# Patient Record
Sex: Male | Born: 1945 | Race: White | Hispanic: No | Marital: Married | Smoking: Former smoker
Health system: Southern US, Community
[De-identification: ages and names within clinical notes are randomized; demographics above are authoritative.]

## PROBLEM LIST (undated history)

## (undated) DIAGNOSIS — J45909 Unspecified asthma, uncomplicated: Secondary | ICD-10-CM

## (undated) DIAGNOSIS — N529 Male erectile dysfunction, unspecified: Secondary | ICD-10-CM

## (undated) DIAGNOSIS — L4050 Arthropathic psoriasis, unspecified: Secondary | ICD-10-CM

## (undated) DIAGNOSIS — K219 Gastro-esophageal reflux disease without esophagitis: Secondary | ICD-10-CM

## (undated) DIAGNOSIS — I2510 Atherosclerotic heart disease of native coronary artery without angina pectoris: Secondary | ICD-10-CM

## (undated) DIAGNOSIS — L409 Psoriasis, unspecified: Secondary | ICD-10-CM

## (undated) DIAGNOSIS — J449 Chronic obstructive pulmonary disease, unspecified: Secondary | ICD-10-CM

## (undated) DIAGNOSIS — I10 Essential (primary) hypertension: Secondary | ICD-10-CM

## (undated) DIAGNOSIS — Z9289 Personal history of other medical treatment: Secondary | ICD-10-CM

## (undated) DIAGNOSIS — Z9861 Coronary angioplasty status: Secondary | ICD-10-CM

## (undated) DIAGNOSIS — E7800 Pure hypercholesterolemia, unspecified: Secondary | ICD-10-CM

## (undated) HISTORY — DX: Male erectile dysfunction, unspecified: N52.9

## (undated) HISTORY — DX: Psoriasis, unspecified: L40.9

## (undated) HISTORY — PX: NM MYOVIEW LTD: HXRAD82

## (undated) HISTORY — DX: Atherosclerotic heart disease of native coronary artery without angina pectoris: I25.10

## (undated) HISTORY — DX: Unspecified asthma, uncomplicated: J45.909

## (undated) HISTORY — DX: Personal history of other medical treatment: Z92.89

## (undated) HISTORY — DX: Arthropathic psoriasis, unspecified: L40.50

## (undated) HISTORY — DX: Coronary angioplasty status: Z98.61

## (undated) HISTORY — DX: Chronic obstructive pulmonary disease, unspecified: J44.9

---

## 1996-11-30 DIAGNOSIS — I2510 Atherosclerotic heart disease of native coronary artery without angina pectoris: Secondary | ICD-10-CM

## 1996-11-30 HISTORY — DX: Atherosclerotic heart disease of native coronary artery without angina pectoris: I25.10

## 1997-06-06 HISTORY — PX: CORONARY STENT PLACEMENT: SHX1402

## 1998-03-27 ENCOUNTER — Ambulatory Visit (HOSPITAL_COMMUNITY): Admission: RE | Admit: 1998-03-27 | Discharge: 1998-03-27 | Payer: Self-pay | Admitting: Cardiology

## 1998-03-30 HISTORY — PX: CARDIAC CATHETERIZATION: SHX172

## 1998-04-03 ENCOUNTER — Ambulatory Visit (HOSPITAL_COMMUNITY): Admission: RE | Admit: 1998-04-03 | Discharge: 1998-04-03 | Payer: Self-pay | Admitting: Cardiology

## 1998-07-22 ENCOUNTER — Encounter: Admission: RE | Admit: 1998-07-22 | Discharge: 1998-07-22 | Payer: Self-pay | Admitting: *Deleted

## 1998-11-27 ENCOUNTER — Encounter: Payer: Self-pay | Admitting: *Deleted

## 1998-11-27 ENCOUNTER — Ambulatory Visit (HOSPITAL_COMMUNITY): Admission: RE | Admit: 1998-11-27 | Discharge: 1998-11-27 | Payer: Self-pay | Admitting: *Deleted

## 1998-11-28 ENCOUNTER — Observation Stay (HOSPITAL_COMMUNITY): Admission: EM | Admit: 1998-11-28 | Discharge: 1998-11-29 | Payer: Self-pay | Admitting: Certified Registered"

## 1998-12-23 ENCOUNTER — Ambulatory Visit (HOSPITAL_COMMUNITY): Admission: RE | Admit: 1998-12-23 | Discharge: 1998-12-23 | Payer: Self-pay | Admitting: Gastroenterology

## 2001-07-14 ENCOUNTER — Encounter: Payer: Self-pay | Admitting: Cardiology

## 2001-07-14 ENCOUNTER — Ambulatory Visit (HOSPITAL_COMMUNITY): Admission: RE | Admit: 2001-07-14 | Discharge: 2001-07-14 | Payer: Self-pay | Admitting: Cardiology

## 2001-12-06 HISTORY — PX: CORONARY ANGIOPLASTY: SHX604

## 2001-12-24 ENCOUNTER — Encounter: Payer: Self-pay | Admitting: Emergency Medicine

## 2001-12-24 ENCOUNTER — Inpatient Hospital Stay (HOSPITAL_COMMUNITY): Admission: EM | Admit: 2001-12-24 | Discharge: 2001-12-27 | Payer: Self-pay | Admitting: Emergency Medicine

## 2001-12-25 ENCOUNTER — Encounter: Payer: Self-pay | Admitting: Cardiovascular Disease

## 2002-05-09 ENCOUNTER — Ambulatory Visit (HOSPITAL_COMMUNITY): Admission: RE | Admit: 2002-05-09 | Discharge: 2002-05-10 | Payer: Self-pay | Admitting: Cardiology

## 2002-05-10 HISTORY — PX: CORONARY STENT PLACEMENT: SHX1402

## 2002-10-10 ENCOUNTER — Ambulatory Visit (HOSPITAL_COMMUNITY): Admission: RE | Admit: 2002-10-10 | Discharge: 2002-10-10 | Payer: Self-pay | Admitting: Specialist

## 2002-10-10 ENCOUNTER — Encounter: Payer: Self-pay | Admitting: Specialist

## 2002-11-21 ENCOUNTER — Encounter: Admission: RE | Admit: 2002-11-21 | Discharge: 2002-11-21 | Payer: Self-pay | Admitting: Otolaryngology

## 2002-11-21 ENCOUNTER — Encounter: Payer: Self-pay | Admitting: Otolaryngology

## 2004-01-01 HISTORY — PX: CARDIAC CATHETERIZATION: SHX172

## 2004-01-05 DIAGNOSIS — Z9289 Personal history of other medical treatment: Secondary | ICD-10-CM

## 2004-01-05 HISTORY — DX: Personal history of other medical treatment: Z92.89

## 2004-01-16 ENCOUNTER — Ambulatory Visit (HOSPITAL_COMMUNITY): Admission: RE | Admit: 2004-01-16 | Discharge: 2004-01-16 | Payer: Self-pay | Admitting: Otolaryngology

## 2004-01-16 ENCOUNTER — Ambulatory Visit: Admission: RE | Admit: 2004-01-16 | Discharge: 2004-01-16 | Payer: Self-pay | Admitting: Otolaryngology

## 2004-01-16 IMAGING — CR DG CHEST 2V
2 series · 2 of 2 positions shown · non-contrast
Comparison: none

CLINICAL DATA: Preoperative exam for sinus disease. 
 CHEST, TWO VIEWS  
 The heart and mediastinum are normal.  The lungs are clear.  No soft tissue or bony abnormality. 
 IMPRESSION
 1.  Normal chest.

[view not recorded (1 of 2)]
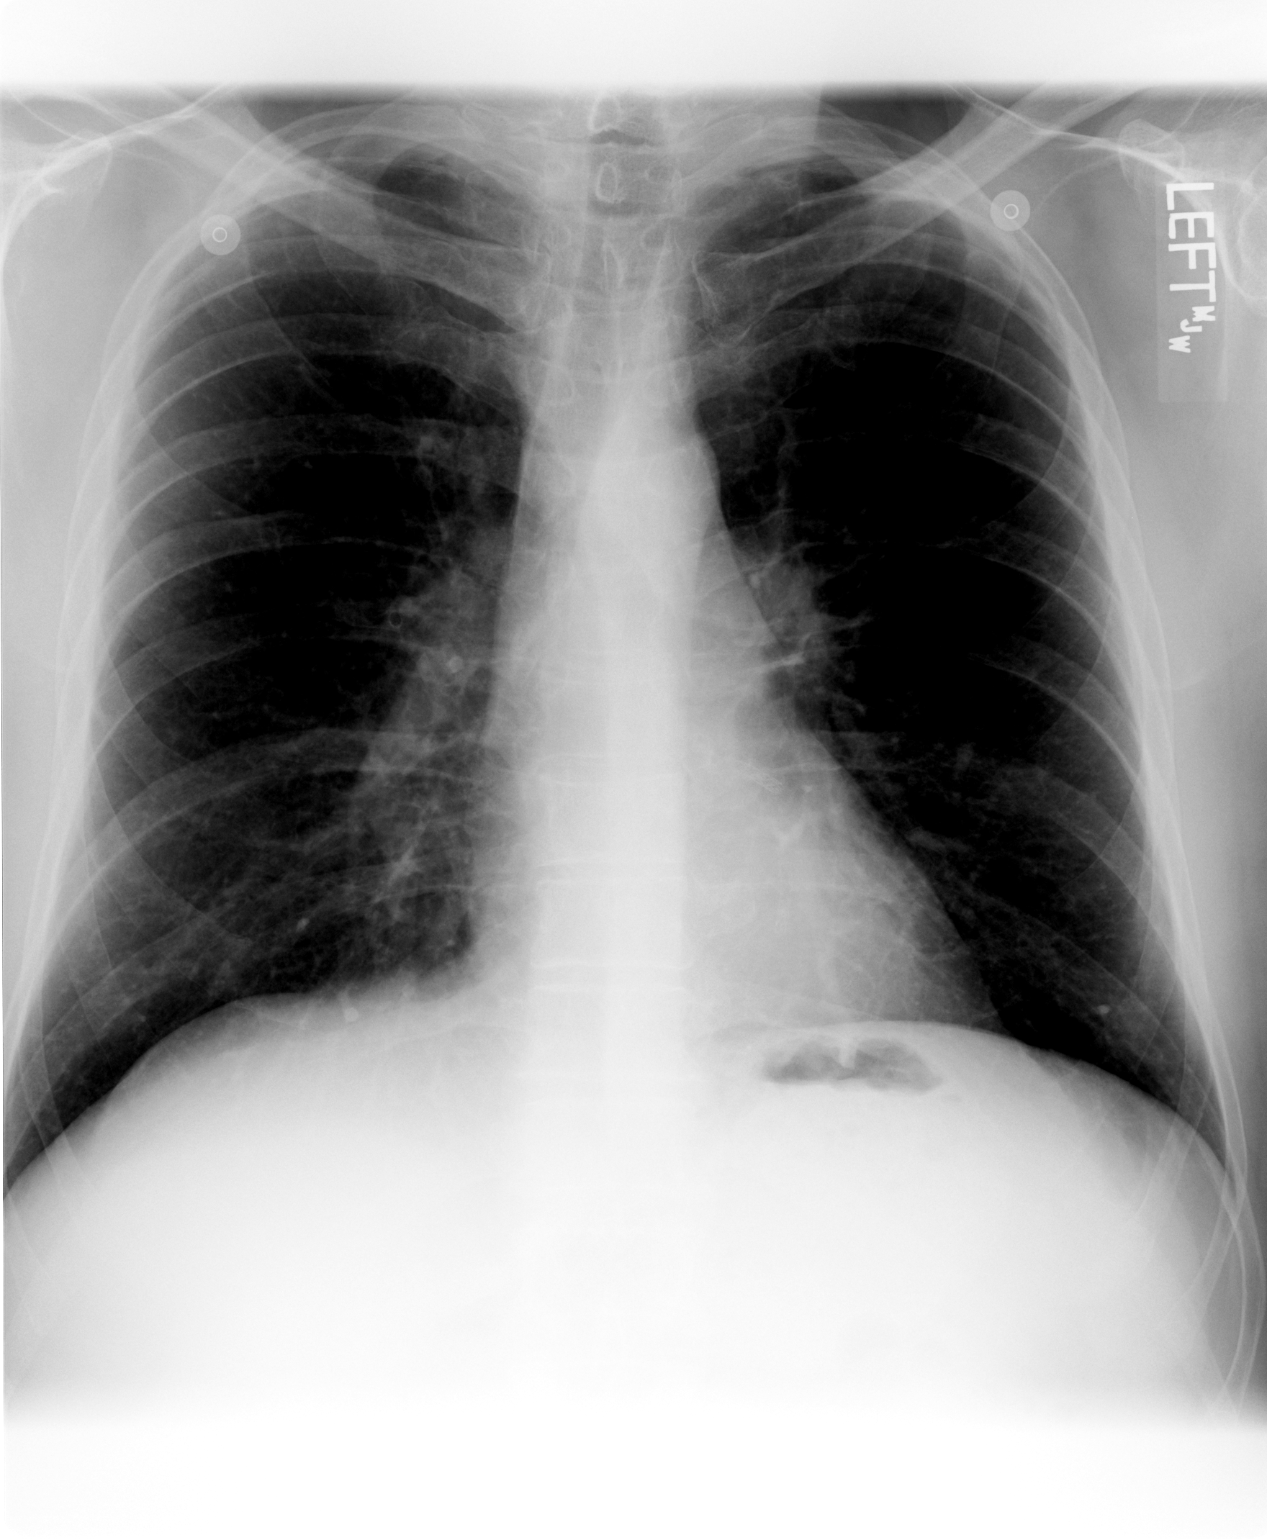

[view not recorded (2 of 2)]
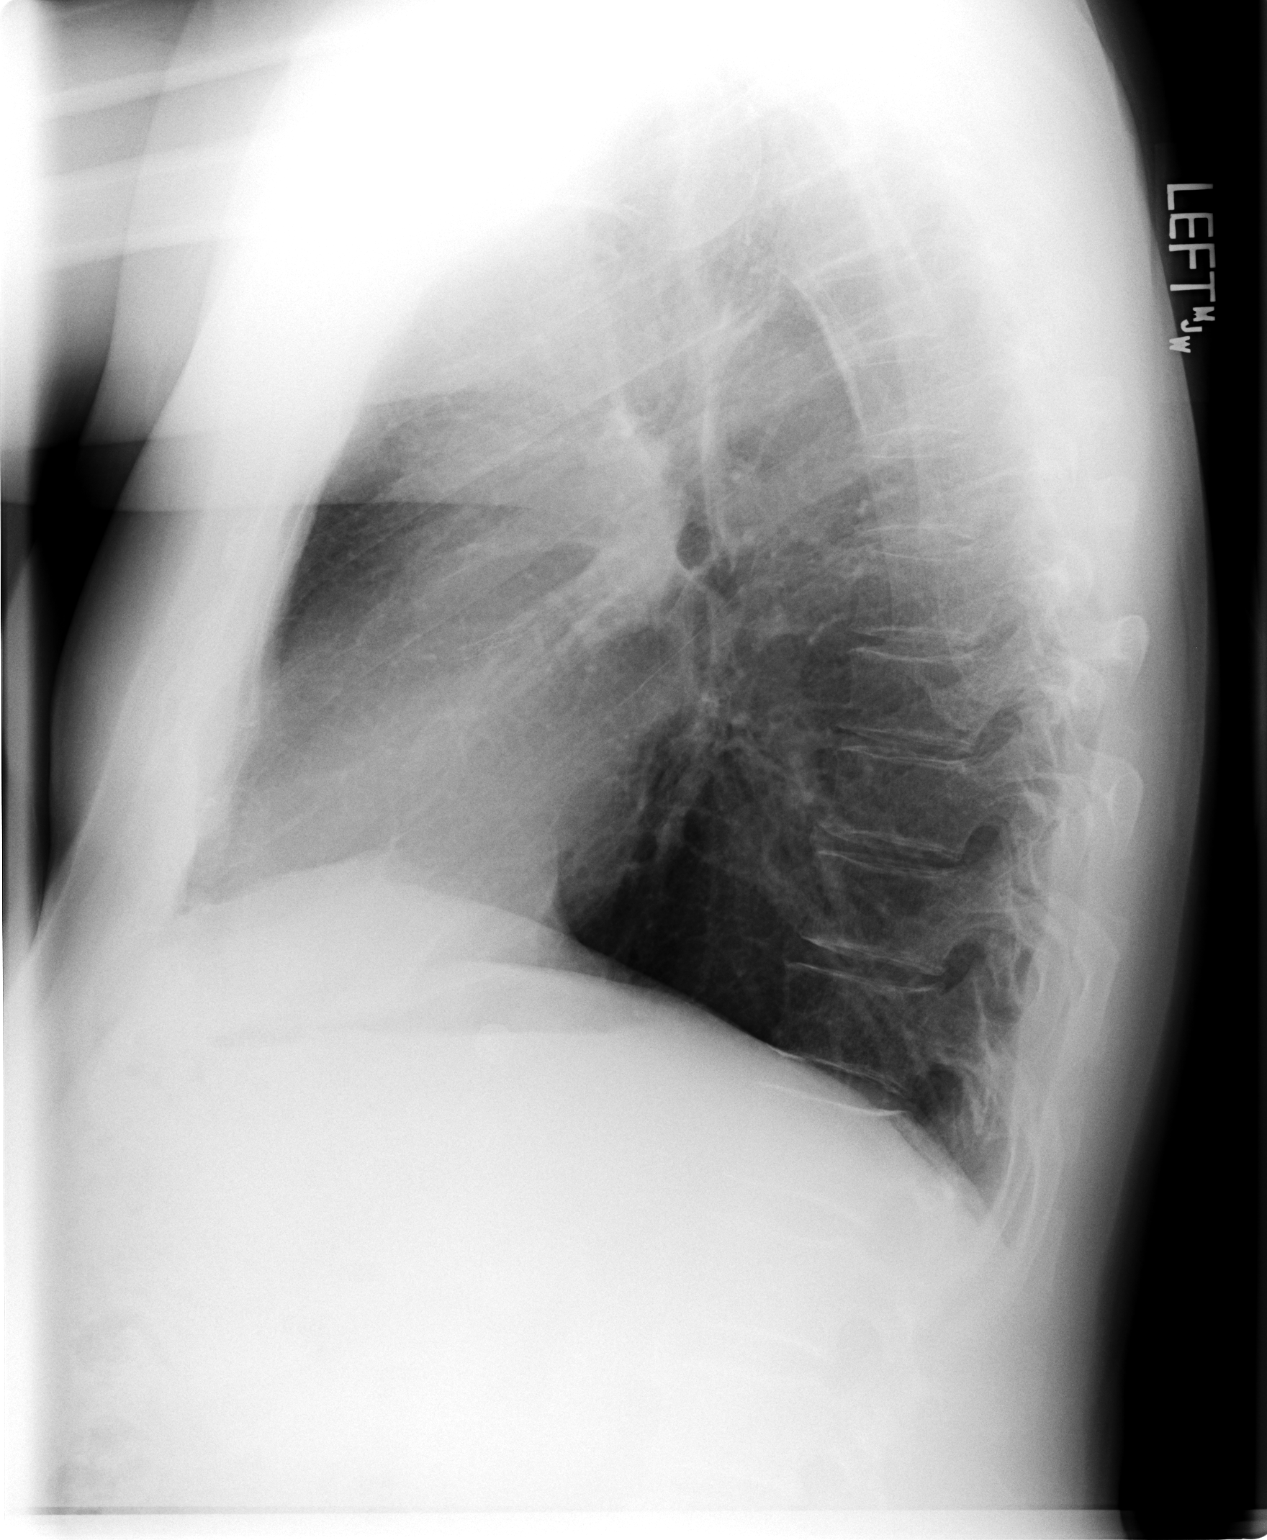

[2 of 2 positions shown; findings below may reference images not displayed]

## 2004-01-22 ENCOUNTER — Ambulatory Visit (HOSPITAL_COMMUNITY): Admission: RE | Admit: 2004-01-22 | Discharge: 2004-01-22 | Payer: Self-pay | Admitting: Cardiology

## 2004-04-30 ENCOUNTER — Ambulatory Visit (HOSPITAL_COMMUNITY): Admission: RE | Admit: 2004-04-30 | Discharge: 2004-05-01 | Payer: Self-pay | Admitting: Otolaryngology

## 2008-08-21 LAB — HM COLONOSCOPY

## 2011-01-08 ENCOUNTER — Ambulatory Visit
Admission: RE | Admit: 2011-01-08 | Discharge: 2011-01-08 | Disposition: A | Discharge status: Home / Self Care | Payer: BC Managed Care – PPO | Source: Ambulatory Visit | Attending: Family Medicine | Admitting: Family Medicine

## 2011-01-08 ENCOUNTER — Other Ambulatory Visit: Payer: Self-pay | Admitting: Family Medicine

## 2011-01-08 DIAGNOSIS — R52 Pain, unspecified: Secondary | ICD-10-CM

## 2013-03-02 ENCOUNTER — Emergency Department (HOSPITAL_COMMUNITY)
Admission: EM | Admit: 2013-03-02 | Discharge: 2013-03-02 | Disposition: A | Discharge status: Home / Self Care | Payer: Medicare Other | Attending: Emergency Medicine | Admitting: Emergency Medicine

## 2013-03-02 ENCOUNTER — Emergency Department (HOSPITAL_COMMUNITY): Payer: Medicare Other

## 2013-03-02 ENCOUNTER — Other Ambulatory Visit: Payer: Self-pay

## 2013-03-02 ENCOUNTER — Encounter (HOSPITAL_COMMUNITY): Payer: Self-pay

## 2013-03-02 DIAGNOSIS — Z79899 Other long term (current) drug therapy: Secondary | ICD-10-CM | POA: Insufficient documentation

## 2013-03-02 DIAGNOSIS — Z9861 Coronary angioplasty status: Secondary | ICD-10-CM | POA: Insufficient documentation

## 2013-03-02 DIAGNOSIS — E7800 Pure hypercholesterolemia, unspecified: Secondary | ICD-10-CM | POA: Insufficient documentation

## 2013-03-02 DIAGNOSIS — R0602 Shortness of breath: Secondary | ICD-10-CM | POA: Insufficient documentation

## 2013-03-02 DIAGNOSIS — Z87891 Personal history of nicotine dependence: Secondary | ICD-10-CM | POA: Insufficient documentation

## 2013-03-02 DIAGNOSIS — Z7982 Long term (current) use of aspirin: Secondary | ICD-10-CM | POA: Insufficient documentation

## 2013-03-02 DIAGNOSIS — Z9889 Other specified postprocedural states: Secondary | ICD-10-CM | POA: Insufficient documentation

## 2013-03-02 DIAGNOSIS — H81399 Other peripheral vertigo, unspecified ear: Secondary | ICD-10-CM | POA: Insufficient documentation

## 2013-03-02 DIAGNOSIS — I10 Essential (primary) hypertension: Secondary | ICD-10-CM | POA: Insufficient documentation

## 2013-03-02 DIAGNOSIS — K219 Gastro-esophageal reflux disease without esophagitis: Secondary | ICD-10-CM | POA: Insufficient documentation

## 2013-03-02 HISTORY — DX: Essential (primary) hypertension: I10

## 2013-03-02 HISTORY — DX: Gastro-esophageal reflux disease without esophagitis: K21.9

## 2013-03-02 HISTORY — DX: Pure hypercholesterolemia, unspecified: E78.00

## 2013-03-02 LAB — CBC
Hemoglobin: 12.4 g/dL — ABNORMAL LOW (ref 13.0–17.0)
RBC: 4.08 MIL/uL — ABNORMAL LOW (ref 4.22–5.81)

## 2013-03-02 LAB — BASIC METABOLIC PANEL
CO2: 29 mEq/L (ref 19–32)
Glucose, Bld: 106 mg/dL — ABNORMAL HIGH (ref 70–99)
Potassium: 3.4 mEq/L — ABNORMAL LOW (ref 3.5–5.1)
Sodium: 138 mEq/L (ref 135–145)

## 2013-03-02 LAB — POCT I-STAT TROPONIN I

## 2013-03-02 LAB — D-DIMER, QUANTITATIVE: D-Dimer, Quant: 0.32 ug/mL-FEU (ref 0.00–0.48)

## 2013-03-02 MED ORDER — ASPIRIN 325 MG PO TABS
325.0000 mg | ORAL_TABLET | Freq: Once | ORAL | Status: AC
  Administered 2013-03-02: 325 mg via ORAL
  Filled 2013-03-02: qty 1

## 2013-03-02 MED ORDER — LORAZEPAM 2 MG/ML IJ SOLN
1.0000 mg | Freq: Once | INTRAMUSCULAR | Status: AC
  Administered 2013-03-02: 1 mg via INTRAVENOUS

## 2013-03-02 MED ORDER — ONDANSETRON 4 MG PO TBDP: ORAL_TABLET | ORAL | Status: DC

## 2013-03-02 MED ORDER — MECLIZINE HCL 50 MG PO TABS: 25.0000 mg | ORAL_TABLET | Freq: Three times a day (TID) | ORAL | Status: DC | PRN

## 2013-03-02 MED ORDER — LORAZEPAM 2 MG/ML IJ SOLN
INTRAMUSCULAR | Status: AC
  Filled 2013-03-02: qty 1

## 2013-03-02 NOTE — ED Notes (Signed)
Yelverton MD at bedside. 

## 2013-03-02 NOTE — ED Notes (Signed)
Pt c/o sudden onset of dizziness, head spinning, unable to fully stand up, SOB, and mid-sternum  Chest pressure starting approx 1300 today. Pt denies cough, congestion, N/V/D, back/abd pain, or diaphoresis

## 2013-03-02 NOTE — ED Notes (Signed)
Pt dc'd home w/all belongings, alert and ambulatory upon dc, pt verbalizes understanding of dc instructions, driven home by spouse , 2 new rx prescribed

## 2013-03-02 NOTE — ED Notes (Addendum)
Pt was on his tractor an hour ago and began to feel dizzy. States he has chest pressure and discomfort and He has a hx of 2 stents. Took 1 nitro with no relief.

## 2013-03-02 NOTE — ED Notes (Signed)
Pt placed on heart, spo2, bp monitor. Pt place in gown. Family at bedside

## 2013-03-02 NOTE — ED Notes (Signed)
Pt ambulated to bathroom, steady gait noted

## 2013-03-02 NOTE — ED Provider Notes (Signed)
History     CSN: [id number]  Arrival date & time 03/02/13  1430   First MD Initiated Contact with Patient 03/02/13 1513      Chief Complaint  Patient presents with  . Chest Pain    (Consider location/radiation/quality/duration/timing/severity/associated sxs/prior treatment) HPI Pt states that at roughly 1330 today pt was working on tractor and had sudden onset spinning sensation and loss of balance. No nausea. No focal weakness or sensory changes. Pt states he was breathing heavy but denies chest pain. He took a NTG without relief. His dizziness has since improved though not completely abated. Worse with movement of his head. Similar episode several weeks ago that lasted several hours and resolved. No recent illnesses. No ear pain or hearing changes.  Past Medical History  Diagnosis Date  . Hypertension   . GERD (gastroesophageal reflux disease)   . Hypercholesteremia     Past Surgical History  Procedure Laterality Date  . Cardiac surgery    . Coronary stent placement      No family history on file.  History  Substance Use Topics  . Smoking status: Former Games developer  . Smokeless tobacco: Current User  . Alcohol Use: Yes     Comment: occassionally      Review of Systems  Constitutional: Negative for fever and chills.  HENT: Negative for neck pain.   Eyes: Negative for visual disturbance.  Respiratory: Positive for shortness of breath. Negative for cough, chest tightness and wheezing.   Cardiovascular: Negative for chest pain, palpitations and leg swelling.  Gastrointestinal: Negative for nausea, vomiting and abdominal pain.  Musculoskeletal: Positive for gait problem. Negative for back pain.  Skin: Negative for rash and wound.  Neurological: Positive for dizziness. Negative for speech difficulty, weakness, light-headedness, numbness and headaches.  All other systems reviewed and are negative.    Allergies  Review of patient's allergies indicates no known  allergies.  Home Medications   Current Outpatient Rx  Name  Route  Sig  Dispense  Refill  . aspirin EC 81 MG tablet   Oral   Take 81 mg by mouth daily.         [name] etanercept (ENBREL) 50 MG/ML injection   Subcutaneous   Inject 50 mg into the skin 3 (three) times a week.         [name] omeprazole (PRILOSEC OTC) 20 MG tablet   Oral   Take 20 mg by mouth daily.         . simvastatin (ZOCOR) 20 MG tablet   Oral   Take 20 mg by mouth every morning.         . meclizine (ANTIVERT) 50 MG tablet   Oral   Take 0.5 tablets (25 mg total) by mouth 3 (three) times daily as needed for dizziness.   30 tablet   0   . ondansetron (ZOFRAN ODT) 4 MG disintegrating tablet      4mg  ODT q4 hours prn nausea/vomit   10 tablet   0     BP 122/66  Pulse 62  Temp(Src) 97.9 F (36.6 C) (Oral)  Resp 20  Ht 5\' 10"  (1.778 m)  Wt 225 lb (102.059 kg)  BMI 32.28 kg/m2  SpO2 96%  Physical Exam  Nursing note and vitals reviewed. Constitutional: He is oriented to person, place, and time. He appears well-developed and well-nourished. No distress.  HENT:  Head: Normocephalic and atraumatic.  Right Ear: External ear normal.  Left Ear: External ear normal.  Mouth/Throat: Oropharynx is  clear and moist.  Eyes: EOM are normal. Pupils are equal, round, and reactive to light.  Fatigable horizontal nystagmus   Neck: Normal range of motion. Neck supple.  Cardiovascular: Normal rate and regular rhythm.   Pulmonary/Chest: Effort normal and breath sounds normal. No respiratory distress. He has no wheezes. He has no rales. He exhibits no tenderness.  Abdominal: Soft. Bowel sounds are normal. He exhibits no distension and no mass. There is no tenderness. There is no rebound and no guarding.  Musculoskeletal: Normal range of motion. He exhibits no edema and no tenderness.  Neurological: He is alert and oriented to person, place, and time.  CN II-XII intact. 5/5 motor in all ext, sensation intact. bl finger to  nose intact.   Skin: Skin is warm and dry. No rash noted. No erythema.  Psychiatric: He has a normal mood and affect. His behavior is normal.    ED Course  Procedures (including critical care time)  Labs Reviewed  CBC - Abnormal; Notable for the following:    RBC 4.08 (*)    Hemoglobin 12.4 (*)    HCT 35.3 (*)    All other components within normal limits  BASIC METABOLIC PANEL - Abnormal; Notable for the following:    Potassium 3.4 (*)    Glucose, Bld 106 (*)    GFR calc non Af Amer 72 (*)    GFR calc Af Amer 83 (*)    All other components within normal limits  D-DIMER, QUANTITATIVE  TROPONIN I  POCT I-STAT TROPONIN I   Dg Chest 2 View  03/02/2013  *RADIOLOGY REPORT*  Clinical Data: Chest pain.  CHEST - 2 VIEW  Comparison: January 16, 2004.  Findings: Cardiomediastinal silhouette appears normal.  No acute pulmonary disease is noted.  Bony thorax is intact.  IMPRESSION: No acute cardiopulmonary abnormality seen.   Original Report Authenticated By: Lupita Raider.,  M.D.    Mr Brain Wo Contrast  03/02/2013  *RADIOLOGY REPORT*  Clinical Data: Vertigo.  Rule out stroke  MRI HEAD WITHOUT CONTRAST  Technique:  Multiplanar, multiecho pulse sequences of the brain and surrounding structures were obtained according to standard protocol without intravenous contrast.  Comparison: None  Findings: Negative for acute infarct.  Mild chronic microvascular ischemic changes in the white matter and pons.  Negative for mass or edema.  No hemorrhage or fluid collection.  Extensive mucosal edema in the paranasal sinuses.  Prior medial antrectomy bilaterally.  IMPRESSION: Negative for acute infarct.  Chronic sinus disease.   Original Report Authenticated By: Janeece Riggers, M.D.      1. Peripheral vertigo, unspecified laterality      Date: 03/02/2013  Rate: 67  Rhythm: normal sinus rhythm  QRS Axis: normal  Intervals: normal  ST/T Wave abnormalities: normal  Conduction Disutrbances:none  Narrative  Interpretation:   Old EKG Reviewed: none available    MDM   Pt required dose of ativan for MRI. Though Vertigo had resolved had mild lightheadedness after medication. Pt has now been observed in Ed >7 hours. He is ambulating without assistance. Denies chest pain or SOB. Trop x 2 neg. MRI neg for acute stroke. Believe symptoms represent peripheral vertigo. Will prescribe meclizine and given ENT f/u. Pt given return precautions.         Loren Racer, MD 03/02/13 2152

## 2013-04-27 ENCOUNTER — Other Ambulatory Visit: Payer: Self-pay

## 2013-04-27 MED ORDER — METOPROLOL SUCCINATE ER 25 MG PO TB24: 25.0000 mg | ORAL_TABLET | Freq: Every day | ORAL | Status: DC

## 2013-04-27 NOTE — Telephone Encounter (Signed)
Rx was sent to pharmacy electronically. 

## 2013-05-31 ENCOUNTER — Encounter: Payer: Self-pay | Admitting: *Deleted

## 2013-06-05 ENCOUNTER — Encounter: Payer: Self-pay | Admitting: Cardiology

## 2013-06-07 ENCOUNTER — Ambulatory Visit (INDEPENDENT_AMBULATORY_CARE_PROVIDER_SITE_OTHER): Payer: Medicare Other | Admitting: Cardiology

## 2013-06-07 ENCOUNTER — Encounter: Payer: Self-pay | Admitting: Cardiology

## 2013-06-07 VITALS — BP 132/82 | HR 70 | Ht 70.0 in | Wt 212.9 lb

## 2013-06-07 DIAGNOSIS — Z9861 Coronary angioplasty status: Secondary | ICD-10-CM

## 2013-06-07 DIAGNOSIS — E782 Mixed hyperlipidemia: Secondary | ICD-10-CM

## 2013-06-07 DIAGNOSIS — I10 Essential (primary) hypertension: Secondary | ICD-10-CM

## 2013-06-07 DIAGNOSIS — I2510 Atherosclerotic heart disease of native coronary artery without angina pectoris: Secondary | ICD-10-CM

## 2013-06-07 DIAGNOSIS — N529 Male erectile dysfunction, unspecified: Secondary | ICD-10-CM

## 2013-06-07 DIAGNOSIS — Z79899 Other long term (current) drug therapy: Secondary | ICD-10-CM

## 2013-06-07 DIAGNOSIS — E7800 Pure hypercholesterolemia, unspecified: Secondary | ICD-10-CM

## 2013-06-07 NOTE — Progress Notes (Signed)
Patient ID: Brad Owen, male   DOB: 06-16-1946, 67 y.o.   MRN: [id number]  Clinic Note: HPI: Brad Owen is a 67 y.o. male with a PMH below who presents today for six-month followup. He is a former patient of Dr. Junie Bame was soft the first time back in January. I will her to see a bit sooner than one year since because he been unable to dyspnea on exertion to the lip above what he normally had. 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 His very active doing heavy exertional labor. This in addition to walking all of the golf course. While he does get short of breath with exertion and is probably lower than it had been before. He does not have any anginal chest discomfort with this level of exertion uncertain at rest. He does not a little ankle swelling but not real significant edema. No PND or orthopnea. He really states that that has not changed dramatically the last 6 months, does not really bother him that much. He denies any palpitations, lightheadedness, dizziness, weakness or syncope/near syncope. No TIA or adverse reactions. No melena, hematochezia, hematuria or nosebleeds. No claudication symptoms. He still notes a little orthostatic dizziness, but not so bad when he is adequately hydrated.  Past Medical History  Diagnosis Date  . Hypertension   . GERD (gastroesophageal reflux disease)   . Hypercholesteremia   . CAD S/P percutaneous coronary angioplasty 1998    BMS to OM, followed by DES in 2003: PTCA a side branch.; Patent stents in 2005.  Brad Owen History of stress test 01/05/2004    abnormal stress test, positive bruce protocol exercise stress test with scintigraphic evidence of mild lateral wall ischemia at the apex. Dynamic gating reveals a preserved EF at 60% by QGS  .  Psoriasis     Patchy psoriasis  . Erectile dysfunction     Prior Cardiac Evaluation and Past Surgical History: Past Surgical History  Procedure Laterality Date  . Cardiac surgery    . Coronary stent placement  06/06/1997    PTCA/Stent Placement-LAD A 3.0 x 15 mm [place] balloon. a 3.0 x 18 mm AVE (GFX) stent, a JL 4 guide catheter and a 300 cm extra-support wire. Because of its proximal location in the LAD, the decision was made to place an AVE stent. the stent we put was placed in position and deployed at 8 atmospheres for 45 seconds.A successful angioplasty and stent placement to a 90% proximal eccentric LAD lesi  . Cardiac catheterization  03/1998    Normal left ventricular systolic function, Minor narrowing in the body of the stent, minor tapering in the distal RCA at the area of the PDA.  . Cardiac catheterization  12/06/2001    STENTS: At this time the 2.5 x 10 cutting balloon was placed down the OM and a total of four inflations were performed, 10 for 58,12 for 62, 12 for 62, and 12x61. these for inflation at the bifurcation didi not impinge on the ostium of the branch, resulted in brisk distal flow and no evidence of any dissection or thrombus.  . Cardiac catheterization  05/10/2002    STENTS: the 2.5 x 15mm [place] balloon was placed in the ostium of the vessel and  2 inflation, 7 x 65 and 8 x 60 were performed, there was less than 10% area of narrowing in the otium of this vassel and there was always TIMI-3 flow, there was also cutting ballon PTCA of the OM branch that was pinched by the stent.  . Cardiac catheterization  02.2005    Normal left ventricular systolic function, patent stents in both the circumflex and left anterior descending with only minimal in-stent restenosis in the left anterior descending.    No Known Allergies  Current Outpatient Prescriptions  Medication Sig Dispense Refill  . aspirin EC 81 MG tablet Take 81 mg by mouth daily.      Brad Owen etanercept (ENBREL) 50  MG/ML injection Inject 50 mg into the skin 3 (three) times a week.      . meclizine (ANTIVERT) 50 MG tablet Take 0.5 tablets (25 mg total) by mouth 3 (three) times daily as needed for dizziness.  30 tablet  0  . metoprolol succinate (TOPROL-XL) 25 MG 24 hr tablet Take 1 tablet (25 mg total) by mouth daily.  90 tablet  2  . omeprazole (PRILOSEC OTC) 20 MG tablet Take 20 mg by mouth daily.      . sildenafil (VIAGRA) 50 MG tablet Take 50 mg by mouth daily as needed for erectile dysfunction.      . simvastatin (ZOCOR) 20 MG tablet Take 20 mg by mouth every morning.      Brad Owen ADVAIR DISKUS 250-50 MCG/DOSE AEPB Inhale 1 puff into the lungs daily.      . fluticasone (FLONASE) 50 MCG/ACT nasal spray Place 1 spray into the nose daily.       No current facility-administered medications for this visit.    History   Social History  . Marital Status: Legally Separated    Spouse Name: N/A    Number of Children: N/A  . Years of Education: N/A   Occupational History  . Not on file.   Social History Main Topics  . Smoking status: Former Games developer  . Smokeless tobacco: Current User    Types: Chew  . Alcohol Use: Yes     Comment: occassionally  . Drug Use: No  . Sexually Active: Not on file   Other Topics Concern  . Not on file   Social History Narrative   He is a married father of 2. He has 3 stepchildren, he has 10 grandchildren and 2 great   Grandchildren. He chews tobacco, does not   smoke. He seldom drinks alcohol.    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 He walks well over the golf course without any significant difficulties. Besides this he really does get routine exercise.     ROS: A comprehensive Review of Systems - Negative except Pertinent positives above and as noted below. is psoriatic rash ecstasies be much improved from last visit. Is no longer having that leg  discomfort.  PHYSICAL EXAM BP 132/82  Pulse 70  Ht 5\' 10"  (1.778 m)  Wt 212 lb 14.4 oz (96.571 kg)  BMI 30.55 kg/m2 General: He is a pleasant, healthy appearing gentleman in no acute distress, alert and oriented x3, answers questions appropriately. He is well-groomed and well-nourished. He has a well-groomed beard.   Normal mood and affect.  HEENT: NCAT, EOMI, MMM. Anicteric sclerae.  Neck: Supple, no LAN, JVD or carotid bruit.  Heart: RRR,  normal S1, S2, occasional ectopic beats but nothing significant; he does not really even feel those. No M/R/G. Nondisplaced PMI.  Lungs: CTAB, nonlabored, normal effort, good air movement.  Abdomen: Soft/NT/ND/NABS. No HSM.  Extremities: No C/C/E, patches of psoriasis on both of his thighs and knees.  strength, normal gait.   [id number] today: Yes Rate:70 , Rhythm: NSR, normal ECG   Recent Labs: None  ASSESSMENT: Relatively stable from a cardiac standpoint.  No real change to his chronic mild exertional dyspnea.  He continues to be active on the golf course with no angina, but does not feeling "out of shape". Dr. Clarene Duke had not been doing recurrent nuclear stress tests simply because of his false-positive in 2005 prompting a catheterization.  CAD S/P percutaneous coronary angioplasty He continues to be relatively stable here. No active symptoms. He is no longer on dual antiplatelet therapy. He is only on aspirin. He is on low-dose beta blocker and statin.  Hypercholesteremia He is on a statin and has note had labs check recently.  I have ordered a Lipid Panel and a comprehensive metabolic panel.   Hypertension Well-controlled on current beta blocker dose. Within having some mild orthostatic symptoms on last visit, I'm inclined to allow his blood pressures to be in the upper normal range. Therefore, I will up titrate or add medications.  Erectile dysfunction He doesn't seem to make much of this. He does have a prescription for Viagra. I did  not specifically ask, but since he is not mentioning it, it would imagine either it is working or he is not using it.   PLAN: Per problem list. Orders Placed This Encounter  Procedures  . Comp Met (CMET)    Order Specific Question:  Has the patient fasted?    Answer:  Yes  . Lipid panel    Order Specific Question:  Has the patient fasted?    Answer:  Yes    Followup: One year  DAVID W. Herbie Baltimore, M.D., M.S. [facility] [address] [place], [place]  [address]  [phone] Pager # [phone]

## 2013-06-07 NOTE — Patient Instructions (Addendum)
Your physician recommends that you have  lab work CMP,LIPID ,nothing to eat or drink 12 hrs prior to test   Your physician wants you to follow-up in 12 months with Dr Herbie Baltimore.   You will receive a reminder letter in the mail two months in advance. If you don't receive a letter, please call our office to schedule the follow-up appointment.

## 2013-06-16 ENCOUNTER — Encounter: Payer: Self-pay | Admitting: Cardiology

## 2013-06-16 DIAGNOSIS — N529 Male erectile dysfunction, unspecified: Secondary | ICD-10-CM | POA: Insufficient documentation

## 2013-06-16 DIAGNOSIS — I2510 Atherosclerotic heart disease of native coronary artery without angina pectoris: Secondary | ICD-10-CM | POA: Insufficient documentation

## 2013-06-16 DIAGNOSIS — E785 Hyperlipidemia, unspecified: Secondary | ICD-10-CM | POA: Insufficient documentation

## 2013-06-16 DIAGNOSIS — I10 Essential (primary) hypertension: Secondary | ICD-10-CM | POA: Insufficient documentation

## 2013-06-16 NOTE — Assessment & Plan Note (Signed)
He is on a statin and has note had labs check recently.  I have ordered a Lipid Panel and a comprehensive metabolic panel.

## 2013-06-16 NOTE — Assessment & Plan Note (Signed)
He doesn't seem to make much of this. He does have a prescription for Viagra. I did not specifically ask, but since he is not mentioning it, it would imagine either it is working or he is not using it.

## 2013-06-16 NOTE — Assessment & Plan Note (Signed)
Well-controlled on current beta blocker dose. Within having some mild orthostatic symptoms on last visit, I'm inclined to allow his blood pressures to be in the upper normal range. Therefore, I will up titrate or add medications.

## 2013-06-16 NOTE — Assessment & Plan Note (Signed)
He continues to be relatively stable here. No active symptoms. He is no longer on dual antiplatelet therapy. He is only on aspirin. He is on low-dose beta blocker and statin.

## 2013-08-22 ENCOUNTER — Encounter: Payer: Self-pay | Admitting: Family Medicine

## 2013-08-22 ENCOUNTER — Ambulatory Visit (INDEPENDENT_AMBULATORY_CARE_PROVIDER_SITE_OTHER): Payer: Medicare Other | Admitting: Family Medicine

## 2013-08-22 VITALS — BP 114/72 | HR 82 | Temp 97.4°F | Resp 18 | Wt 212.0 lb

## 2013-08-22 DIAGNOSIS — Z23 Encounter for immunization: Secondary | ICD-10-CM

## 2013-08-22 DIAGNOSIS — R109 Unspecified abdominal pain: Secondary | ICD-10-CM | POA: Insufficient documentation

## 2013-08-22 LAB — CBC WITH DIFFERENTIAL/PLATELET
Basophils Absolute: 0.1 10*3/uL (ref 0.0–0.1)
HCT: 40.3 % (ref 39.0–52.0)
Hemoglobin: 13.8 g/dL (ref 13.0–17.0)
Lymphocytes Relative: 37 % (ref 12–46)
MCHC: 34.2 g/dL (ref 30.0–36.0)
Monocytes Absolute: 1.5 10*3/uL — ABNORMAL HIGH (ref 0.1–1.0)
Monocytes Relative: 13 % — ABNORMAL HIGH (ref 3–12)
Neutro Abs: 5 10*3/uL (ref 1.7–7.7)
Neutrophils Relative %: 45 % (ref 43–77)
Platelets: 222 10*3/uL (ref 150–400)
RDW: 13.6 % (ref 11.5–15.5)
WBC: 11.1 10*3/uL — ABNORMAL HIGH (ref 4.0–10.5)

## 2013-08-22 LAB — LIPASE: Lipase: <10 U/L (ref 0–75)

## 2013-08-22 LAB — COMPREHENSIVE METABOLIC PANEL
ALT: 20 U/L (ref 0–53)
AST: 17 U/L (ref 0–37)
Albumin: 3.9 g/dL (ref 3.5–5.2)
Alkaline Phosphatase: 62 U/L (ref 39–117)
BUN: 16 mg/dL (ref 6–23)
CO2: 28 mEq/L (ref 19–32)
Calcium: 9.3 mg/dL (ref 8.4–10.5)
Chloride: 101 mEq/L (ref 96–112)
Creat: 1.29 mg/dL (ref 0.50–1.35)
Potassium: 4.7 mEq/L (ref 3.5–5.3)

## 2013-08-22 NOTE — Patient Instructions (Addendum)
Drink fluids,  Call if your pain returns  We will call with results  F/U as needed

## 2013-08-22 NOTE — Assessment & Plan Note (Addendum)
No red flags on exam Pain improved without intervention ? Viral illness Check cmet, lipase cbc , pt request glucose level as well If pain worsens would obtain imaging- CT abd/pelvis

## 2013-08-22 NOTE — Progress Notes (Signed)
  Subjective:    Patient ID: Brad Owen, male    DOB: [birth year], 67 y.o.   MRN: [id number]  HPI  Pt here with abdominal pain. On Saturday had RUQ and epigastric pain, was playing golf when it started but was able to complete the game. Pain improved as the weekend went on. Mild nausea had emesis x 2 on Sunday, appetite now improved. No change in stools, no fever. No current abdominal pain. Also requested blood work and check for diabetes   Review of Systems   GEN- + fatigue, fever, weight loss,weakness, recent illness HEENT- denies eye drainage, change in vision, nasal discharge, CVS- denies chest pain, palpitations RESP- denies SOB, cough, wheeze ABD- denies N/V, change in stools, +abd pain GU- denies dysuria, hematuria, dribbling, incontinence       Objective:   Physical Exam GEN- NAD, alert and oriented x3, well appearing HEENT- PERRL, EOMI, non injected sclera, pink conjunctiva, MMM, oropharynx clear Neck- Supple, no LAD CVS- RRR, no murmur RESP-CTAB ABD-NABS,soft,mild TTP RUQ and Epigastric region, no rebound, no gaurding, no apparent distention, no Hepatomegaly EXT- No edema Pulses- Radial 2+         Assessment & Plan:

## 2013-10-02 ENCOUNTER — Other Ambulatory Visit: Payer: Self-pay | Admitting: *Deleted

## 2013-10-02 MED ORDER — SIMVASTATIN 20 MG PO TABS: 20.0000 mg | ORAL_TABLET | Freq: Every morning | ORAL | Status: DC

## 2013-10-02 NOTE — Telephone Encounter (Signed)
Rx was sent to pharmacy electronically. 

## 2013-12-08 ENCOUNTER — Other Ambulatory Visit: Payer: Self-pay | Admitting: Family Medicine

## 2013-12-08 NOTE — Telephone Encounter (Signed)
Medication refilled per protocol. 

## 2013-12-12 ENCOUNTER — Encounter (HOSPITAL_COMMUNITY): Payer: Self-pay | Admitting: Emergency Medicine

## 2013-12-12 ENCOUNTER — Emergency Department (HOSPITAL_COMMUNITY): Payer: Medicare Other

## 2013-12-12 ENCOUNTER — Emergency Department (HOSPITAL_COMMUNITY)
Admission: EM | Admit: 2013-12-12 | Discharge: 2013-12-12 | Disposition: A | Discharge status: Home / Self Care | Payer: Medicare Other | Attending: Emergency Medicine | Admitting: Emergency Medicine

## 2013-12-12 ENCOUNTER — Telehealth: Payer: Self-pay | Admitting: Cardiology

## 2013-12-12 DIAGNOSIS — E7800 Pure hypercholesterolemia, unspecified: Secondary | ICD-10-CM | POA: Insufficient documentation

## 2013-12-12 DIAGNOSIS — Z87891 Personal history of nicotine dependence: Secondary | ICD-10-CM | POA: Insufficient documentation

## 2013-12-12 DIAGNOSIS — R002 Palpitations: Secondary | ICD-10-CM | POA: Insufficient documentation

## 2013-12-12 DIAGNOSIS — R0989 Other specified symptoms and signs involving the circulatory and respiratory systems: Secondary | ICD-10-CM | POA: Insufficient documentation

## 2013-12-12 DIAGNOSIS — R0602 Shortness of breath: Secondary | ICD-10-CM | POA: Insufficient documentation

## 2013-12-12 DIAGNOSIS — N529 Male erectile dysfunction, unspecified: Secondary | ICD-10-CM | POA: Insufficient documentation

## 2013-12-12 DIAGNOSIS — K219 Gastro-esophageal reflux disease without esophagitis: Secondary | ICD-10-CM | POA: Insufficient documentation

## 2013-12-12 DIAGNOSIS — I2510 Atherosclerotic heart disease of native coronary artery without angina pectoris: Secondary | ICD-10-CM | POA: Insufficient documentation

## 2013-12-12 DIAGNOSIS — Z9189 Other specified personal risk factors, not elsewhere classified: Secondary | ICD-10-CM | POA: Insufficient documentation

## 2013-12-12 DIAGNOSIS — I10 Essential (primary) hypertension: Secondary | ICD-10-CM | POA: Insufficient documentation

## 2013-12-12 DIAGNOSIS — Z79899 Other long term (current) drug therapy: Secondary | ICD-10-CM | POA: Insufficient documentation

## 2013-12-12 DIAGNOSIS — R0609 Other forms of dyspnea: Secondary | ICD-10-CM | POA: Insufficient documentation

## 2013-12-12 DIAGNOSIS — Z9861 Coronary angioplasty status: Secondary | ICD-10-CM | POA: Insufficient documentation

## 2013-12-12 DIAGNOSIS — Z7982 Long term (current) use of aspirin: Secondary | ICD-10-CM | POA: Insufficient documentation

## 2013-12-12 DIAGNOSIS — R000 Tachycardia, unspecified: Secondary | ICD-10-CM

## 2013-12-12 DIAGNOSIS — L408 Other psoriasis: Secondary | ICD-10-CM | POA: Insufficient documentation

## 2013-12-12 LAB — CBC WITH DIFFERENTIAL/PLATELET
Basophils Absolute: 0 10*3/uL (ref 0.0–0.1)
Basophils Relative: 0 % (ref 0–1)
Eosinophils Absolute: 0.4 10*3/uL (ref 0.0–0.7)
Eosinophils Relative: 5 % (ref 0–5)
HCT: 40.6 % (ref 39.0–52.0)
Hemoglobin: 14 g/dL (ref 13.0–17.0)
LYMPHS ABS: 3.9 10*3/uL (ref 0.7–4.0)
LYMPHS PCT: 44 % (ref 12–46)
MCH: 30.4 pg (ref 26.0–34.0)
MCHC: 34.5 g/dL (ref 30.0–36.0)
MCV: 88.1 fL (ref 78.0–100.0)
Monocytes Absolute: 0.8 10*3/uL (ref 0.1–1.0)
Monocytes Relative: 10 % (ref 3–12)
NEUTROS ABS: 3.7 10*3/uL (ref 1.7–7.7)
Neutrophils Relative %: 42 % — ABNORMAL LOW (ref 43–77)
PLATELETS: 210 10*3/uL (ref 150–400)
RBC: 4.61 MIL/uL (ref 4.22–5.81)
RDW: 12.8 % (ref 11.5–15.5)
WBC: 8.8 10*3/uL (ref 4.0–10.5)

## 2013-12-12 LAB — BASIC METABOLIC PANEL
BUN: 13 mg/dL (ref 6–23)
CHLORIDE: 99 meq/L (ref 96–112)
CO2: 28 mEq/L (ref 19–32)
Calcium: 9.5 mg/dL (ref 8.4–10.5)
Creatinine, Ser: 1.06 mg/dL (ref 0.50–1.35)
GFR calc Af Amer: 83 mL/min — ABNORMAL LOW (ref >90)
GFR calc non Af Amer: 71 mL/min — ABNORMAL LOW (ref >90)
GLUCOSE: 114 mg/dL — AB (ref 70–99)
Potassium: 3.9 mEq/L (ref 3.7–5.3)
Sodium: 138 mEq/L (ref 137–147)

## 2013-12-12 LAB — POCT I-STAT TROPONIN I: Troponin i, poc: 0 ng/mL (ref 0.00–0.08)

## 2013-12-12 NOTE — ED Provider Notes (Signed)
CSN: [id number]     Arrival date & time 12/12/13  1130 History   First MD Initiated Contact with Patient 12/12/13 1203     Chief Complaint  Patient presents with  . Palpitations  . Shortness of Breath   (Consider location/radiation/quality/duration/timing/severity/associated sxs/prior Treatment) HPI This is a 68 yo male with a sig. Cardiac history who presents with cc of racing heart. Patient states hw awoke this morning and drank a cup of coffee. Shortly thereafter, his heart began racing and pounding. He had associated dyspnea. He denies chest pain. Diaphoresis, nausea, paresthesia, chest pressure.  Episode lasted approximately 20 minutes. Patient has had previous episodes, howevere they have been much shorter in duration. He called his cardiologist who referred him to the ED for evaluation. Patient has recently been out of work due to FLU. Denies fevers, chills, myalgias, arthralgias.Denies dysuria, flank pain, suprapubic pain, frequency, urgency, or hematuria. Denies headaches, light headedness, weakness, visual disturbances. Denies abdominal pain, nausea, vomiting, diarrhea or constipation.    Past Medical History  Diagnosis Date  . Hypertension   . GERD (gastroesophageal reflux disease)   . Hypercholesteremia   . CAD S/P percutaneous coronary angioplasty 1998    BMS to OM, followed by DES in 2003: PTCA a side branch.; Patent stents in 2005.  [name] History of stress test 01/05/2004    abnormal stress test, positive bruce protocol exercise stress test with scintigraphic evidence of mild lateral wall ischemia at the apex. Dynamic gating reveals a preserved EF at 60% by QGS  . Psoriasis     Patchy psoriasis  . Erectile dysfunction    Past Surgical History  Procedure Laterality Date  . Cardiac surgery    . Coronary stent placement  06/06/1997    PTCA/Stent Placement-LAD A 3.0 x 15 mm [place] balloon. a 3.0 x 18 mm AVE (GFX) stent, a JL 4 guide catheter and a 300 cm extra-support wire.  Because of its proximal location in the LAD, the decision was made to place an Pope stent. the stent we put was placed in position and deployed at 8 atmospheres for 45 seconds.A successful angioplasty and stent placement to a 90% proximal eccentric LAD lesi  . Cardiac catheterization  03/1998    Normal left ventricular systolic function, Minor narrowing in the body of the stent, minor tapering in the distal RCA at the area of the PDA.  . Cardiac catheterization  12/06/2001    STENTS: At this time the 2.5 x 10 cutting balloon was placed down the OM and a total of four inflations were performed, 10 for 58,12 for 62, 12 for 62, and 12x61. these for inflation at the bifurcation didi not impinge on the ostium of the branch, resulted in brisk distal flow and no evidence of any dissection or thrombus.  . Cardiac catheterization  05/10/2002    STENTS: the 2.5 x 57mm [place] balloon was placed in the ostium of the vessel and 2 inflation, 7 x 65 and 8 x 60 were performed, there was less than 10% area of narrowing in the otium of this vassel and there was always TIMI-3 flow, there was also cutting ballon PTCA of the OM branch that was pinched by the stent.  . Cardiac catheterization  02.2005    Normal left ventricular systolic function, patent stents in both the circumflex and left anterior descending with only minimal in-stent restenosis in the left anterior descending.   Family History  Problem Relation Age of Onset  . Heart Problems Mother   .  Diabetes Father   . Heart Problems Father   . Diabetes Sister    History  Substance Use Topics  . Smoking status: Former Research scientist (life sciences)  . Smokeless tobacco: Current User    Types: Chew  . Alcohol Use: Yes     Comment: occassionally    Review of Systems Ten systems reviewed and are negative for acute change, except as noted in the HPI.   Allergies  Review of patient's allergies indicates no known allergies.  Home Medications   Current Outpatient Rx  Name   Route  Sig  Dispense  Refill  . ADVAIR DISKUS 250-50 MCG/DOSE AEPB   Inhalation   Inhale 1 puff into the lungs daily.         [name] aspirin EC 81 MG tablet   Oral   Take 81 mg by mouth daily.         [name] etanercept (ENBREL) 50 MG/ML injection   Subcutaneous   Inject 50 mg into the skin 3 (three) times a week.         . fluticasone (FLONASE) 50 MCG/ACT nasal spray   Nasal   Place 1 spray into the nose daily.         . meclizine (ANTIVERT) 50 MG tablet   Oral   Take 0.5 tablets (25 mg total) by mouth 3 (three) times daily as needed for dizziness.   30 tablet   0   . metoprolol succinate (TOPROL-XL) 25 MG 24 hr tablet   Oral   Take 1 tablet (25 mg total) by mouth daily.   90 tablet   2   . PRILOSEC OTC 20 MG tablet      TAKE 1 TABLET DAILY   30 tablet   5   . sildenafil (VIAGRA) 50 MG tablet   Oral   Take 50 mg by mouth daily as needed for erectile dysfunction.         . simvastatin (ZOCOR) 20 MG tablet   Oral   Take 1 tablet (20 mg total) by mouth every morning.   90 tablet   2    BP 126/74  Pulse 64  Temp(Src) 98.1 F (36.7 C) (Oral)  Resp 15  Ht 5\' 10"  (1.778 m)  Wt 212 lb (96.163 kg)  BMI 30.42 kg/m2  SpO2 99% Physical Exam Physical Exam  Nursing note and vitals reviewed. Constitutional: He appears well-developed and well-nourished. No distress.  HENT:  Head: Normocephalic and atraumatic.  Eyes: Conjunctivae normal are normal. No scleral icterus.  Neck: Normal range of motion. Neck supple.  Cardiovascular: Normal rate, regular rhythm and normal heart sounds.   Pulmonary/Chest: Effort normal and breath sounds normal. No respiratory distress.  Abdominal: Soft. There is no tenderness.  Musculoskeletal: He exhibits no edema.  Neurological: He is alert.  Skin: Skin is warm and dry. He is not diaphoretic.  Psychiatric: His behavior is normal.    ED Course  Procedures (including critical care time) Labs Review Labs Reviewed  CBC WITH  DIFFERENTIAL - Abnormal; Notable for the following:    Neutrophils Relative % 42 (*)    All other components within normal limits  BASIC METABOLIC PANEL - Abnormal; Notable for the following:    Glucose, Bld 114 (*)    GFR calc non Af Amer 71 (*)    GFR calc Af Amer 83 (*)    All other components within normal limits  POCT I-STAT TROPONIN I   Imaging Review Dg Chest 2 View  12/12/2013  CLINICAL DATA:  Palpitations  EXAM: CHEST  2 VIEW  COMPARISON:  03/02/2013  FINDINGS: Cardiomediastinal silhouette is stable. No acute infiltrate or pleural effusion. No pulmonary edema. Bony thorax is unremarkable.  IMPRESSION: No active cardiopulmonary disease.   Electronically Signed   By: Lahoma Crocker M.D.   On: 12/12/2013 12:15    EKG Interpretation   None       Date: 12/13/2013  Rate: 57f  Rhythm: normal sinus rhythm  QRS Axis: normal  Intervals: normal  ST/T Wave abnormalities: normal  Conduction Disutrbances:none  Narrative Interpretation:   Old EKG Reviewed: unchanged   MDM   1. Racing heart beat    Patient here with racing heart.  I feel this was a paraoxysmal tachycardia. EKG unremarkable. I no ekg changes.  I personally reviewed and interpreted EKGs. Patient asymptomatic during ED visit. I have called patient's cardiologist, Dr. Allison Quarry office and patient set up for outpatient Holter monitoring with an appointment this coming Thursday, 12/15/2013.No complaints of CP or concern for ACS. Negative CXR and troponin. I personally reviewed the images using our PACS system. Mild hyperglycemia. I discussed the findings with the patient and POC, with which patient expresess understanding and agreement. Counseled patient on avoiding stimulants. The patient appears reasonably screened and/or stabilized for discharge and I doubt any other medical condition or other [facility] requiring further screening, evaluation, or treatment in the ED at this time prior to discharge.          Margarita Mail, PA-C 12/13/13 0730  Margarita Mail, PA-C 12/13/13 1100

## 2013-12-12 NOTE — Discharge Instructions (Signed)
Your workup was negative here in the ED. I have scheduled an appointment with you on Thursday morning with Dr. Ellyn Hack for Holter monitoring. SEEK IMMEDIATE MEDICAL CARE IF:   You have pain in your chest, upper arms, jaw, or neck.  You become weak, dizzy, or feel faint.  You have palpitations that will not go away.  You vomit, have diarrhea, or pass blood in your stool.  Your skin is cool, pale, and wet.  You have a fever that will not go away with rest, fluids, and medicine.  Holter Monitoring    A Holter monitor is a small device with electrodes (small sticky patches) that attach to your chest. It records the electrical activity of your heart and is worn continuously for 24-48 hours.  A HOLTER MONITOR IS USED TO  Detect heart problems such as:  Heart arrhythmia. Is an abnormal or irregular heartbeat. With some heart arrhythmias, you may not feel or know that you have an irregular heart rhythm.  Palpitations, such as feeling your heart racing or fluttering. It is possible to have heart palpitations and not have a heart arrhythmia.  A heart rhythm that is too slow or too fast.  If you have problems fainting, near fainting or feeling light-headed, a Holter monitor may be worn to see if your heart is the cause. HOLTER MONITOR PREPARATION  Electrodes will be attached to the skin on your chest.  If you have hair on your chest, small areas may have to be shaved. This is done to help the patches stick better and make the recording more accurate.  The electrodes are attached by wires to the Holter monitor. The Holter monitor clips to your clothing. You will wear the monitor at all times, even while exercising and sleeping. HOME CARE INSTRUCTIONS  Wear your monitor at all times.  The wires and the monitor must stay dry. Do not get the monitor wet.  Do not bathe, swim or use a hot tub with it on.  You may do a "sponge" bath while you have the monitor on.  Keep your skin clean, do not put  body lotion or moisturizer on your chest.  It's possible that your skin under the electrodes could become irritated. To keep this from happening, you may put the electrodes in slightly different places on your chest.  Your caregiver will also ask you to keep a diary of your activities, such as walking or doing chores. Be sure to note what you are doing if you experience heart symptoms such as palpitations. This will help your caregiver determine what might be contributing to your symptoms. The information stored in your monitor will be reviewed by your caregiver alongside your diary entries.  Make sure the monitor is safely clipped to your clothing or in a location close to your body that your caregiver recommends.  The monitor and electrodes are removed when the test is over. Return the monitor as directed.  Be sure to follow up with your caregiver and discuss your Holter monitor results. SEEK IMMEDIATE MEDICAL CARE IF:  You faint or feel lightheaded.  You have trouble breathing.  You get pain in your chest, upper arm or jaw.  You feel sick to your stomach and your skin is pale, cool, or damp.  You think something is wrong with the way your heart is beating. MAKE SURE YOU:  Understand these instructions.  Will watch your condition.  Will get help right away if you are not doing well or  get worse. Document Released: 08/14/2004 Document Revised: 02/08/2012 Document Reviewed: 12/27/2008  [facility] Patient Information 2014 [place], [place].   Nonspecific Tachycardia Tachycardia is a faster than normal heartbeat (more than 100 beats per minute). In adults, the heart normally beats between 60 and 100 times a minute. A fast heartbeat may be a normal response to exercise or stress. It does not necessarily mean that something is wrong. However, sometimes when your heart beats too fast it may not be able to pump enough blood to the rest of your body. This can result in chest pain, shortness of breath,  dizziness, and even fainting. Nonspecific tachycardia means that the specific cause or pattern of your tachycardia is unknown. CAUSES  Tachycardia may be harmless or it may be due to a more serious underlying cause. Possible causes of tachycardia include:  Exercise or exertion.  Fever.  Pain or injury.  Infection.  Loss of body fluids (dehydration).  Overactive thyroid.  Lack of red blood cells (anemia).  Anxiety and stress.  Alcohol.  Caffeine.  Tobacco products.  Diet pills.  Illegal drugs.  Heart disease. SYMPTOMS  Rapid or irregular heartbeat (palpitations).  Suddenly feeling your heart beating (cardiac awareness).  Dizziness.  Tiredness (fatigue).  Shortness of breath.  Chest pain.  Nausea.  Fainting. DIAGNOSIS  Your caregiver will perform a physical exam and take your medical history. In some cases, a heart specialist (cardiologist) may be consulted. Your caregiver may also order:  Blood tests.  Electrocardiography. This test records the electrical activity of your heart.  A heart monitoring test. TREATMENT  Treatment will depend on the likely cause of your tachycardia. The goal is to treat the underlying cause of your tachycardia. Treatment methods may include:  Replacement of fluids or blood through an intravenous (IV) tube for moderate to severe dehydration or anemia.  New medicines or changes in your current medicines.  Diet and lifestyle changes.  Treatment for certain infections.  Stress relief or relaxation methods. HOME CARE INSTRUCTIONS   Rest.  Drink enough fluids to keep your urine clear or pale yellow.  Do not smoke.  Avoid:  Caffeine.  Tobacco.  Alcohol.  Chocolate.  Stimulants such as over-the-counter diet pills or pills that help you stay awake.  Situations that cause anxiety or stress.  Illegal drugs such as marijuana, phencyclidine (PCP), and cocaine.  Only take medicine as directed by your  caregiver.  Keep all follow-up appointments as directed by your caregiver. MAKE SURE YOU:   Understand these instructions.  Will watch your condition.  Will get help right away if you are not doing well or get worse. Document Released: 12/24/2004 Document Revised: 02/08/2012 Document Reviewed: 10/27/2011 [facility] Patient Information 2014 [place], [place].

## 2013-12-12 NOTE — Telephone Encounter (Signed)
Of note, no available appts today.

## 2013-12-12 NOTE — Telephone Encounter (Signed)
Please call asap-feeling funny and weak.Hisheart was racing a lot this morning.He needs to know what to do.

## 2013-12-12 NOTE — Telephone Encounter (Signed)
Returned call and pt verified x 2.  Pt c/o feeling "short-winded and light-headed."  Stated after he took his shower his heart started pounding fast.  Pt stated after sitting a few minutes he tried to shave and his heart started speeding up again.  Pt stated he tried to lie down and it eased off and got up about 9-9:30am and he feels light-headed.  C/o SOB w/ any movement.  Dr. Ellyn Hack notified and advised ER now for evaluation.  Pt informed and agreed w/ plan.  Pt will have wife take him to ER.  Call to [facility] and informed pt sent to ER.

## 2013-12-12 NOTE — ED Notes (Signed)
Pt presents to department for evaluation of SOB and palpitations. Onset this morning while at home. States he got out of shower and immediately felt his heart "racing." denies chest pain. Respirations unlabored. Skin warm and dry. Cardiac history, stent placement x2. Pt is alert and oriented x4.

## 2013-12-13 NOTE — ED Provider Notes (Signed)
Medical screening examination/treatment/procedure(s) were conducted as a shared visit with non-physician practitioner(s) and myself.  I personally evaluated the patient during the encounter.  EKG Interpretation    Date/Time:  Tuesday December 12 2013 11:33:14 EST Ventricular Rate:  79 PR Interval:  152 QRS Duration: 86 QT Interval:  388 QTC Calculation: 444 R Axis:   77 Text Interpretation:  Normal sinus rhythm Normal ECG No significant change was found Confirmed by Yoana Staib  MD, Odyssey Vasbinder ([id number]) on 12/13/2013 11:25:19 AM            Asymptomatic now. Well appearing. Pt will be set up for outpatient holter monitor and cardiology follow up. He understands to return to ER for new or worsening symptoms  Hoy Morn, MD 12/13/13 1126

## 2013-12-14 ENCOUNTER — Ambulatory Visit (INDEPENDENT_AMBULATORY_CARE_PROVIDER_SITE_OTHER): Payer: Medicare Other | Admitting: *Deleted

## 2013-12-14 ENCOUNTER — Other Ambulatory Visit: Payer: Self-pay | Admitting: Dermatology

## 2013-12-14 DIAGNOSIS — R002 Palpitations: Secondary | ICD-10-CM

## 2013-12-14 NOTE — Patient Instructions (Signed)
Monitor was place on patient

## 2013-12-29 ENCOUNTER — Telehealth: Payer: Self-pay | Admitting: *Deleted

## 2013-12-29 MED ORDER — OMEPRAZOLE MAGNESIUM 20 MG PO TBEC: 20.0000 mg | DELAYED_RELEASE_TABLET | Freq: Every day | ORAL | Status: DC

## 2013-12-29 NOTE — Telephone Encounter (Signed)
[place] sent to pharmacy for Omeprazole

## 2013-12-30 ENCOUNTER — Other Ambulatory Visit: Payer: Self-pay | Admitting: Cardiovascular Disease

## 2014-01-01 NOTE — Telephone Encounter (Signed)
Rx was sent to pharmacy electronically. 

## 2014-02-06 ENCOUNTER — Ambulatory Visit (INDEPENDENT_AMBULATORY_CARE_PROVIDER_SITE_OTHER): Payer: Medicare Other | Admitting: Family Medicine

## 2014-02-06 ENCOUNTER — Other Ambulatory Visit: Payer: Self-pay | Admitting: Family Medicine

## 2014-02-06 ENCOUNTER — Encounter: Payer: Self-pay | Admitting: Family Medicine

## 2014-02-06 VITALS — BP 116/72 | HR 78 | Temp 97.8°F | Resp 18 | Ht 68.0 in | Wt 214.0 lb

## 2014-02-06 DIAGNOSIS — Z1211 Encounter for screening for malignant neoplasm of colon: Secondary | ICD-10-CM

## 2014-02-06 DIAGNOSIS — M7062 Trochanteric bursitis, left hip: Secondary | ICD-10-CM

## 2014-02-06 DIAGNOSIS — M76899 Other specified enthesopathies of unspecified lower limb, excluding foot: Secondary | ICD-10-CM

## 2014-02-06 MED ORDER — ALBUTEROL SULFATE HFA 108 (90 BASE) MCG/ACT IN AERS: 2.0000 | INHALATION_SPRAY | Freq: Four times a day (QID) | RESPIRATORY_TRACT | Status: DC | PRN

## 2014-02-06 MED ORDER — PREDNISONE 20 MG PO TABS: ORAL_TABLET | ORAL | Status: DC

## 2014-02-06 NOTE — Progress Notes (Signed)
Subjective:    Patient ID: Brad Owen, male    DOB: [birth year], 68 y.o.   MRN: [id number]  HPI  Patient is 1 week of pain in his left hip. The pain is located over the greater trochanteric bursa. It is tender to palpation. It hurts to lie on his night. He radiates down the iliotibial band to his knee.  He denies low back pain. He denies tobacco. He has full range of motion in his left hip without pain. He denies any injury to the left hip. He denies any fall. Manson Passey Current Outpatient Prescriptions on File Prior to Visit  Medication Sig Dispense Refill  . ADVAIR DISKUS 250-50 MCG/DOSE AEPB Inhale 1 puff into the lungs daily.      [name] aspirin EC 81 MG tablet Take 81 mg by mouth daily.      [name] etanercept (ENBREL) 50 MG/ML injection Inject 50 mg into the skin 2 (two) times a week. No specific days. Usually every 3 or 4 days.      . fluticasone (FLONASE) 50 MCG/ACT nasal spray Place 1 spray into the nose daily as needed for allergies (congestion).       . metoprolol succinate (TOPROL-XL) 25 MG 24 hr tablet TAKE 1 TABLET DAILY  90 tablet  1  . omeprazole (PRILOSEC OTC) 20 MG tablet Take 1 tablet (20 mg total) by mouth daily.  90 tablet  4  . simvastatin (ZOCOR) 20 MG tablet Take 1 tablet (20 mg total) by mouth every morning.  90 tablet  2   No current facility-administered medications on file prior to visit.   No Known Allergies History   Social History  . Marital Status: Legally Separated    Spouse Name: N/A    Number of Children: N/A  . Years of Education: N/A   Occupational History  . Not on file.   Social History Main Topics  . Smoking status: Former Research scientist (life sciences)  . Smokeless tobacco: Current User    Types: Chew  . Alcohol Use: Yes     Comment: occassionally  . Drug Use: No  . Sexual Activity: Not on file   Other Topics Concern  . Not on file   Social History Narrative   He is a married father of 2. He has 3 stepchildren, he has 10 grandchildren and 2 great   Grandchildren. He  chews tobacco, does not   smoke. He seldom drinks alcohol.    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 He walks well over the golf course without any significant difficulties. Besides this he really does get routine exercise.      Review of Systems  All other systems reviewed and are negative.       Objective:   Physical Exam  Vitals reviewed. Cardiovascular: Normal rate and regular rhythm.   Pulmonary/Chest: Effort normal and breath sounds normal.  Musculoskeletal:       Left hip: He exhibits tenderness and bony tenderness. He exhibits normal range of motion, normal strength, no crepitus, no deformity and no laceration.   patient is tender to palpation over the greater trochanter.          Assessment & Plan:  1. Greater trochanteric bursitis of left hip Patient is an injection in the hip. He defers that for now. I might try prednisone taper pack first. Recheck in 1-2 weeks.  If no better I recommend a cortisone injection into the greater trochanteric bursa - predniSONE (DELTASONE) 20 MG tablet; 3 tabs poqday 1-2, 2 tabs poqday 3-4, 1 tab poqday 5-6  Dispense: 12 tablet; Refill: 0

## 2014-03-09 ENCOUNTER — Encounter: Payer: Self-pay | Admitting: Family Medicine

## 2014-04-25 ENCOUNTER — Ambulatory Visit: Payer: Medicare Other | Admitting: Family Medicine

## 2014-04-26 ENCOUNTER — Encounter: Payer: Self-pay | Admitting: Family Medicine

## 2014-04-26 ENCOUNTER — Ambulatory Visit (INDEPENDENT_AMBULATORY_CARE_PROVIDER_SITE_OTHER): Payer: Medicare Other | Admitting: Family Medicine

## 2014-04-26 VITALS — BP 126/74 | HR 72 | Temp 97.7°F | Resp 18 | Ht 68.0 in | Wt 208.0 lb

## 2014-04-26 DIAGNOSIS — R1013 Epigastric pain: Secondary | ICD-10-CM

## 2014-04-26 DIAGNOSIS — R82998 Other abnormal findings in urine: Secondary | ICD-10-CM

## 2014-04-26 LAB — CBC WITH DIFFERENTIAL/PLATELET
Basophils Absolute: 0.1 10*3/uL (ref 0.0–0.1)
Basophils Relative: 1 % (ref 0–1)
EOS PCT: 6 % — AB (ref 0–5)
Eosinophils Absolute: 0.5 10*3/uL (ref 0.0–0.7)
HCT: 37.3 % — ABNORMAL LOW (ref 39.0–52.0)
Hemoglobin: 12.5 g/dL — ABNORMAL LOW (ref 13.0–17.0)
LYMPHS ABS: 2.6 10*3/uL (ref 0.7–4.0)
LYMPHS PCT: 34 % (ref 12–46)
MCH: 29.4 pg (ref 26.0–34.0)
MCHC: 33.5 g/dL (ref 30.0–36.0)
MCV: 87.8 fL (ref 78.0–100.0)
MONO ABS: 0.8 10*3/uL (ref 0.1–1.0)
Monocytes Relative: 11 % (ref 3–12)
Neutro Abs: 3.6 10*3/uL (ref 1.7–7.7)
Neutrophils Relative %: 48 % (ref 43–77)
PLATELETS: 179 10*3/uL (ref 150–400)
RBC: 4.25 MIL/uL (ref 4.22–5.81)
RDW: 13.8 % (ref 11.5–15.5)
WBC: 7.6 10*3/uL (ref 4.0–10.5)

## 2014-04-26 LAB — COMPLETE METABOLIC PANEL WITH GFR
ALBUMIN: 3.8 g/dL (ref 3.5–5.2)
ALT: 180 U/L — AB (ref 0–53)
AST: 56 U/L — ABNORMAL HIGH (ref 0–37)
Alkaline Phosphatase: 179 U/L — ABNORMAL HIGH (ref 39–117)
BUN: 13 mg/dL (ref 6–23)
CHLORIDE: 103 meq/L (ref 96–112)
CO2: 27 mEq/L (ref 19–32)
CREATININE: 1.04 mg/dL (ref 0.50–1.35)
Calcium: 8.8 mg/dL (ref 8.4–10.5)
GFR, Est African American: 85 mL/min
GFR, Est Non African American: 74 mL/min
Glucose, Bld: 109 mg/dL — ABNORMAL HIGH (ref 70–99)
POTASSIUM: 3.8 meq/L (ref 3.5–5.3)
Sodium: 138 mEq/L (ref 135–145)
Total Bilirubin: 1.4 mg/dL — ABNORMAL HIGH (ref 0.2–1.2)
Total Protein: 7.1 g/dL (ref 6.0–8.3)

## 2014-04-26 LAB — URINALYSIS, ROUTINE W REFLEX MICROSCOPIC
Bilirubin Urine: NEGATIVE
Glucose, UA: NEGATIVE mg/dL
Hgb urine dipstick: NEGATIVE
Ketones, ur: NEGATIVE mg/dL
LEUKOCYTES UA: NEGATIVE
NITRITE: NEGATIVE
PH: 5.5 (ref 5.0–8.0)
Protein, ur: NEGATIVE mg/dL
Specific Gravity, Urine: <1.005 — ABNORMAL LOW (ref 1.005–1.030)
UROBILINOGEN UA: 0.2 mg/dL (ref 0.0–1.0)

## 2014-04-26 LAB — LIPASE: Lipase: 98 U/L — ABNORMAL HIGH (ref 0–75)

## 2014-04-26 NOTE — Progress Notes (Signed)
Subjective:    Patient ID: Brad Owen, male    DOB: 1946/08/25, 68 y.o.   MRN: [id number]  HPI Patient has a history of coronary artery disease. His last catheterization was in 2005 at that time his stents were clear. Over the last 2 weeks he's noticed dyspnea on exertion. He awoke Sunday with intense epigastric abdominal pain. It radiated from below his right nipple left nipple. It was constant and severe. He vomited 2 times. Patient went home and the pain subsided after a few hours. The day before,  he drank approximately 5 beers.  He has not had any further pain since that time. He does continue to have shortness of breath with exertion. He gets easily winded walking ess than 100 feet. This is a sudden change in this patient. Past Medical History  Diagnosis Date  . Hypertension   . GERD (gastroesophageal reflux disease)   . Hypercholesteremia   . CAD S/P percutaneous coronary angioplasty 1998    BMS to OM, followed by DES in 2003: PTCA a side branch.; Patent stents in 2005.  [name] History of stress test 01/05/2004    abnormal stress test, positive bruce protocol exercise stress test with scintigraphic evidence of mild lateral wall ischemia at the apex. Dynamic gating reveals a preserved EF at 60% by QGS  . Psoriasis     Patchy psoriasis  . Erectile dysfunction    Current Outpatient Prescriptions on File Prior to Visit  Medication Sig Dispense Refill  . ADVAIR DISKUS 250-50 MCG/DOSE AEPB Inhale 1 puff into the lungs daily.      [name] albuterol (PROVENTIL HFA;VENTOLIN HFA) 108 (90 BASE) MCG/ACT inhaler Inhale 2 puffs into the lungs every 6 (six) hours as needed for wheezing or shortness of breath.  1 Inhaler  5  . aspirin EC 81 MG tablet Take 81 mg by mouth daily.      [name] etanercept (ENBREL) 50 MG/ML injection Inject 50 mg into the skin 2 (two) times a week. No specific days. Usually every 3 or 4 days.      . fluticasone (FLONASE) 50 MCG/ACT nasal spray Place 1 spray into the nose daily as  needed for allergies (congestion).       . metoprolol succinate (TOPROL-XL) 25 MG 24 hr tablet TAKE 1 TABLET DAILY  90 tablet  1  . omeprazole (PRILOSEC OTC) 20 MG tablet Take 1 tablet (20 mg total) by mouth daily.  90 tablet  4  . simvastatin (ZOCOR) 20 MG tablet Take 1 tablet (20 mg total) by mouth every morning.  90 tablet  2   No current facility-administered medications on file prior to visit.   No Known Allergies History   Social History  . Marital Status: Legally Separated    Spouse Name: N/A    Number of Children: N/A  . Years of Education: N/A   Occupational History  . Not on file.   Social History Main Topics  . Smoking status: Former Research scientist (life sciences)  . Smokeless tobacco: Current User    Types: Chew  . Alcohol Use: Yes     Comment: occassionally  . Drug Use: No  . Sexual Activity: Not on file   Other Topics Concern  . Not on file   Social History Narrative   He is a married father of 2. He has 3 stepchildren, he has 10 grandchildren and 2 great   Grandchildren. He chews tobacco, does not   smoke. He seldom drinks alcohol.    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 He walks well over the golf course without any significant difficulties. Besides this he really does get routine exercise.       Review of Systems  All other systems reviewed and are negative.      Objective:   Physical Exam  Vitals reviewed. Constitutional: He appears well-developed and well-nourished.  Eyes: No scleral icterus.  Neck: Neck supple. No JVD present. No thyromegaly present.  Cardiovascular: Normal rate, regular rhythm and normal heart sounds.   No murmur heard. Pulmonary/Chest: Effort normal and breath sounds normal. No respiratory distress. He has no wheezes. He has no rales.  Abdominal: Soft. Bowel sounds are normal. He exhibits no distension. There is no tenderness.  There is no rebound and no guarding.  Musculoskeletal: He exhibits no edema.  Lymphadenopathy:    He has no cervical adenopathy.          Assessment & Plan:  1. Dark urine Urinalysis is clear. There is no myoglobinuria or bilirubinuria.   - Urinalysis, Routine w reflex microscopic  2. Abdominal pain, epigastric Differential diagnosis includes atypical cardiac chest pain, pancreatitis, choledocholithiasis/biliary colic.  EKG today in the office shows normal sinus rhythm at 75 beats per minute with normal intervals and has a normal axis.  There are no ischemic changes seen on EKG.  Given the location of the pain, I believe cardiac source is unlikely. I did recommend that the patient call his cardiologist so that they may work up his dyspnea on exertion further.  At this time I believe his abdominal pain was likely due to either mild pancreatitis or possibly choledocholithiasis. I will schedule the patient for right quadrant [place] and check lipase. - COMPLETE METABOLIC PANEL WITH GFR - CBC with Differential - Lipase - EKG 12-Lead

## 2014-04-27 ENCOUNTER — Other Ambulatory Visit: Payer: Self-pay | Admitting: Family Medicine

## 2014-04-27 ENCOUNTER — Telehealth: Payer: Self-pay | Admitting: Family Medicine

## 2014-04-27 NOTE — Telephone Encounter (Signed)
error 

## 2014-04-30 ENCOUNTER — Ambulatory Visit
Admission: RE | Admit: 2014-04-30 | Discharge: 2014-04-30 | Disposition: A | Discharge status: Home / Self Care | Payer: Medicare Other | Source: Ambulatory Visit | Attending: Family Medicine | Admitting: Family Medicine

## 2014-04-30 DIAGNOSIS — R1013 Epigastric pain: Secondary | ICD-10-CM

## 2014-05-01 ENCOUNTER — Other Ambulatory Visit: Payer: Self-pay | Admitting: Family Medicine

## 2014-05-01 DIAGNOSIS — R1011 Right upper quadrant pain: Secondary | ICD-10-CM

## 2014-05-01 DIAGNOSIS — K8020 Calculus of gallbladder without cholecystitis without obstruction: Secondary | ICD-10-CM

## 2014-05-04 ENCOUNTER — Encounter (INDEPENDENT_AMBULATORY_CARE_PROVIDER_SITE_OTHER): Payer: Self-pay | Admitting: General Surgery

## 2014-05-04 ENCOUNTER — Ambulatory Visit (INDEPENDENT_AMBULATORY_CARE_PROVIDER_SITE_OTHER): Payer: Medicare Other | Admitting: General Surgery

## 2014-05-04 VITALS — BP 128/82 | HR 84 | Temp 97.9°F | Ht 70.0 in | Wt 209.0 lb

## 2014-05-04 DIAGNOSIS — K8020 Calculus of gallbladder without cholecystitis without obstruction: Secondary | ICD-10-CM

## 2014-05-04 NOTE — Progress Notes (Signed)
Subjective:   abdominal pain, gallstones  Patient ID: Brad Owen, male   DOB: 12-25-1945, 67 y.o.   MRN: [id number]  HPI Patient is a very pleasant 68 year old male referred by Dr. Dennard Schaumann after a recent episode of abdominal pain. About 10 days ago he developed an acute episode of severe abdominal pain at work. He described aching and pressure-like epigastric pain radiating to both subcostal margins. This was associated with nausea and vomiting. He went home and took some over-the-counter medication and rested and within a few hours the pain was going away and resolved by the next day. He saw Dr. Dennard Schaumann and lab work and abdominal ultrasound was obtained. This revealed mildly elevated LFTs and lipase an ultrasound showing multiple stones and slight thickening of the gallbladder wall. Since that time he has had no further episodes of pain and has been feeling well. No fever chills or jaundice. He has no chronic GI complaints or any previous similar problems.  Past Medical History  Diagnosis Date  . Hypertension   . GERD (gastroesophageal reflux disease)   . Hypercholesteremia   . CAD S/P percutaneous coronary angioplasty 1998    BMS to OM, followed by DES in 2003: PTCA a side branch.; Patent stents in 2005.  [name] History of stress test 01/05/2004    abnormal stress test, positive bruce protocol exercise stress test with scintigraphic evidence of mild lateral wall ischemia at the apex. Dynamic gating reveals a preserved EF at 60% by QGS  . Psoriasis     Patchy psoriasis  . Erectile dysfunction   . Asthma    Past Surgical History  Procedure Laterality Date  . Cardiac surgery    . Coronary stent placement  06/06/1997    PTCA/Stent Placement-LAD A 3.0 x 15 mm [place] balloon. a 3.0 x 18 mm AVE (GFX) stent, a JL 4 guide catheter and a 300 cm extra-support wire. Because of its proximal location in the LAD, the decision was made to place an [place] stent. the stent we put was placed in position and  deployed at 8 atmospheres for 45 seconds.A successful angioplasty and stent placement to a 90% proximal eccentric LAD lesi  . Cardiac catheterization  03/1998    Normal left ventricular systolic function, Minor narrowing in the body of the stent, minor tapering in the distal RCA at the area of the PDA.  . Cardiac catheterization  12/06/2001    STENTS: At this time the 2.5 x 10 cutting balloon was placed down the OM and a total of four inflations were performed, 10 for 58,12 for 62, 12 for 62, and 12x61. these for inflation at the bifurcation didi not impinge on the ostium of the branch, resulted in brisk distal flow and no evidence of any dissection or thrombus.  . Cardiac catheterization  05/10/2002    STENTS: the 2.5 x 15mm [place] balloon was placed in the ostium of the vessel and 2 inflation, 7 x 65 and 8 x 60 were performed, there was less than 10% area of narrowing in the otium of this vassel and there was always TIMI-3 flow, there was also cutting ballon PTCA of the OM branch that was pinched by the stent.  . Cardiac catheterization  02.2005    Normal left ventricular systolic function, patent stents in both the circumflex and left anterior descending with only minimal in-stent restenosis in the left anterior descending.   Current Outpatient Prescriptions  Medication Sig Dispense Refill  . ADVAIR DISKUS 250-50 MCG/DOSE AEPB  USE ONE INHALATION TWO TIMES A DAY  3 each  3  . albuterol (PROVENTIL HFA;VENTOLIN HFA) 108 (90 BASE) MCG/ACT inhaler Inhale 2 puffs into the lungs every 6 (six) hours as needed for wheezing or shortness of breath.  1 Inhaler  5  . aspirin EC 81 MG tablet Take 81 mg by mouth daily.      [name] etanercept (ENBREL) 50 MG/ML injection Inject 50 mg into the skin 2 (two) times a week. No specific days. Usually every 3 or 4 days.      . fluticasone (FLONASE) 50 MCG/ACT nasal spray Place 1 spray into the nose daily as needed for allergies (congestion).       . metoprolol succinate  (TOPROL-XL) 25 MG 24 hr tablet TAKE 1 TABLET DAILY  90 tablet  1  . omeprazole (PRILOSEC OTC) 20 MG tablet Take 1 tablet (20 mg total) by mouth daily.  90 tablet  4  . simvastatin (ZOCOR) 20 MG tablet Take 1 tablet (20 mg total) by mouth every morning.  90 tablet  2   No current facility-administered medications for this visit.   No Known Allergies   Review of Systems  Constitutional: Negative for fever, chills and fatigue.  HENT: Positive for congestion.   Eyes: Negative.   Respiratory: Positive for cough. Negative for chest tightness, shortness of breath and wheezing.   Cardiovascular: Negative.        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 Negative for diarrhea, constipation, blood in stool, abdominal distention and anal bleeding.  Genitourinary: Negative.   Musculoskeletal: Positive for arthralgias.  Skin: Positive for rash.       Objective:   Physical Exam BP 128/82  Pulse 84  Temp(Src) 97.9 F (36.6 C)  Ht 5\' 10"  (1.778 m)  Wt 209 lb (94.802 kg)  BMI 29.99 kg/m2 General: Moderately overweight Caucasian male in no distress Skin: Scaly erythematous plaque-like rash mostly of her lower extremities, slightly on trunk and arms HEENT: No icterus. No palpable masses or thyromegaly Lymph nodes: No cervical, subclavicular or axillary nodes palpable Lungs: Clear equal breath sounds without wheezing or rhonchi Cardiovascular: Regular rate and rhythm. No murmurs. No edema. Abdomen: Mild right upper quadrant tenderness. No guarding or peritoneal signs. Extremities: No edema or deformity or joint swelling Neurologic: Alert and fully oriented. Affect normal. Gait normal.  Lab Results  Component Value Date   ALT 180* 04/26/2014   AST 56* 04/26/2014   ALKPHOS 179* 04/26/2014   BILITOT 1.4* 04/26/2014   Lab Results  Component Value Date   LIPASE 98* 04/26/2014   Lab Results  Component Value  Date   WBC 7.6 04/26/2014   HGB 12.5* 04/26/2014   HCT 37.3* 04/26/2014   MCV 87.8 04/26/2014   PLT 179 04/26/2014   Ultrasound: Multiple gallstones and sludge, mild thickening of the fundus, CBD 5 mm within normal limits    Assessment:     Recent episode of severe abdominal pain and colic compatible with biliary tract disease. He had mildly elevated LFTs and lipase, possible passed common bile duct stone or mild pancreatitis. He has mild right upper quadrant tenderness currently.     Plan:     I recommend proceeding with laparoscopic cholecystectomy with cholangiogram to prevent further problems.I discussed the procedure in detail.  The patient was given educational material.  We discussed the risks and benefits of a laparoscopic cholecystectomy and possible cholangiogram including, but  not limited to bleeding, infection, injury to surrounding structures such as the intestine or liver, bile leak, retained gallstones, need to convert to an open procedure, prolonged diarrhea, blood clots such as  DVT, common bile duct injury, anesthesia risks, and possible need for additional procedures.  The likelihood of improvement in symptoms and return to the patient's normal status is good. We discussed the typical post-operative recovery course .We will schedule as an outpatient under general anesthesia after we obtained cardiac clearance

## 2014-05-07 ENCOUNTER — Ambulatory Visit (INDEPENDENT_AMBULATORY_CARE_PROVIDER_SITE_OTHER): Payer: Medicare Other | Admitting: Cardiology

## 2014-05-07 ENCOUNTER — Encounter: Payer: Self-pay | Admitting: Cardiology

## 2014-05-07 VITALS — BP 118/72 | HR 66 | Ht 70.0 in | Wt 211.0 lb

## 2014-05-07 DIAGNOSIS — E785 Hyperlipidemia, unspecified: Secondary | ICD-10-CM

## 2014-05-07 DIAGNOSIS — Z79899 Other long term (current) drug therapy: Secondary | ICD-10-CM

## 2014-05-07 NOTE — Patient Instructions (Signed)
Your physician recommends that you schedule a follow-up appointment in: 2-3 Months

## 2014-05-11 ENCOUNTER — Ambulatory Visit: Payer: Medicare Other | Admitting: Cardiology

## 2014-05-13 NOTE — Progress Notes (Signed)
Patient ID: Brad Owen, male   DOB: 1946-07-29, 68 y.o.   MRN: [id number]    05/07/2014 Brad Owen   [birth year]  [id number]  Primary Physician Jenna Luo TOM, MD Primary Cardiologist: Dr. Ellyn Hack   HPI:  Brad Owen is a 68 year old male, followed by Dr. Ellyn Hack, who presents to clinic today for evaluation for pre-operative assessment, for surgical clearance, to undergo elective laparoscopic cholecystectomy. His cardiac history is significant for CAD, status post percutaneous coronary angioplasty in 1998, resulting in a bare-metal stent to OM, followed by drug-eluting stent to the OM in 2003. He had a repeat left heart catheterization in 2005 which demonstrated patent stents. Left ventricular ejection fraction was estimated at 60%. His history is also significant for treated hypertension and hyperlipidemia. He denies a history of diabetes. He does not smoke.  His last office visit with Dr. Ellyn Hack was 06/07/2013. At that time he was felt to be stable from a cardiac standpoint and was instructed to followup in one year for repeat assessment.  He presents to clinic today without any cardiac complaints. He denies resting or exertional angina. He also denies dyspnea, orthopnea, PND, LEE, dizziness, palpitations, syncope/near-syncope. He states that he is able to perform moderate intensity yardwork on a regular basis without any limitations. 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 He denies ever having any chest discomfort or shortness of breath on his job. He reports that he is fully compliant with his prescribed medications. His only complaint is intermittent right upper quadrant pain. He has been evaluated by Dr. Excell Seltzer. He had an ultrasound which revealed multiple gallstones and sludge. Dr. Excell Seltzer has recommended proceeding with laparoscopic cholecystectomy with cholangiogram.   Current Outpatient Prescriptions  Medication Sig  Dispense Refill  . ADVAIR DISKUS 250-50 MCG/DOSE AEPB USE ONE INHALATION TWO TIMES A DAY  3 each  3  . albuterol (PROVENTIL HFA;VENTOLIN HFA) 108 (90 BASE) MCG/ACT inhaler Inhale 2 puffs into the lungs every 6 (six) hours as needed for wheezing or shortness of breath.  1 Inhaler  5  . aspirin EC 81 MG tablet Take 81 mg by mouth daily.      . clobetasol ointment (TEMOVATE) 0.05 % daily.      [name] etanercept (ENBREL) 50 MG/ML injection Inject 50 mg into the skin 2 (two) times a week. No specific days. Usually every 3 or 4 days.      . fluticasone (FLONASE) 50 MCG/ACT nasal spray Place 1 spray into the nose daily as needed for allergies (congestion).       . metoprolol succinate (TOPROL-XL) 25 MG 24 hr tablet TAKE 1 TABLET DAILY  90 tablet  1  . omeprazole (PRILOSEC OTC) 20 MG tablet Take 1 tablet (20 mg total) by mouth daily.  90 tablet  4  . simvastatin (ZOCOR) 20 MG tablet Take 1 tablet (20 mg total) by mouth every morning.  90 tablet  2   No current facility-administered medications for this visit.    No Known Allergies  History   Social History  . Marital Status: Married    Spouse Name: N/A    Number of Children: N/A  . Years of Education: N/A   Occupational History  . Not on file.   Social History Main Topics  . Smoking status: Former Research scientist (life sciences)  . Smokeless tobacco: Current User    Types: Chew  . Alcohol Use: Yes     Comment: occassionally  .  Drug Use: No  . Sexual Activity: Not on file   Other Topics Concern  . Not on file   Social History Narrative   He is a married father of 2. He has 3 stepchildren, he has 10 grandchildren and 2 great   Grandchildren. He chews tobacco, does not   smoke. He seldom drinks alcohol.    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 He walks well over the golf course without any significant difficulties. Besides this he really does get  routine exercise.      Review of Systems: General: negative for chills, fever, night sweats or weight changes.  Cardiovascular: negative for chest pain, dyspnea on exertion, edema, orthopnea, palpitations, paroxysmal nocturnal dyspnea or shortness of breath Dermatological: negative for rash Respiratory: negative for cough or wheezing Urologic: negative for hematuria Abdominal: negative for nausea, vomiting, diarrhea, bright red blood per rectum, melena, or hematemesis Neurologic: negative for visual changes, syncope, or dizziness All other systems reviewed and are otherwise negative except as noted above.    Blood pressure 118/72, pulse 66, height 5\' 10"  (1.778 m), weight 211 lb (95.709 kg).  General appearance: alert, cooperative and no distress Neck: no carotid bruit and no JVD Lungs: clear to auscultation bilaterally Heart: regular rate and rhythm, S1, S2 normal, no murmur, click, rub or gallop Extremities: no LEE Pulses: 2+ and symmetric Skin: warm and dry Neurologic: Grossly normal  EKG Normal sinus rhythm. Heart rate 67 beats per minute  ASSESSMENT AND PLAN:   1. CAD: History of PCI plus stenting of the OM vessel in 1998 and again in 2003, with repeat cath in 2005 revealing patent stents and normal LV function. He denies any recurrent anginal pain. His EKG demonstrates normal sinus rhythm without ischemic changes. He is on aspirin, a beta blocker and statin.  2. Hypertension: Well controlled. Blood pressure today is 118/72.  3. Hyperlipidemia: This is followed by his PCP, however he states that, historically, his levels have been well controlled on statin therapy.  4. Preoperative Risk Assessment: Revised Cardiac Risk Index  High risk surgery?  No  CAD?    Yes  CHF?    No   Cerebral vascular disease? No  DM on insulin?  No  SCr >2?   No  His estimated risk of adverse outcome with noncardiac surgery: LOW RISK  Estimated rate of myocardial infarction, pulmonary  embolism, ventricular fibrillation, cardiac arrest or complete heart block: 0.9%  PLAN   [name] appears to be stable from a cardiac standpoint. He is able to perform at least 4 METs without angina or dyspnea. Using the Revised Cardiac Risk Index, his estimated risk of adverse outcome with noncardiac surgery is low risk. His estimated rate of myocardial infarction, pulmonary embolism, ventricular fibrillation, cardiac arrest or complete heart block as less than 1%, calculated at 0.9%. Based on his recent history, vital signs, physical exam, EKG and calculated risk, he is considered stable from a cardiac standpoint to undergo laparoscopic cholecystectomy. I recommend that he continue on his beta blocker and statin during the perioperative period. This information will be sent to his primary cardiologist,  Dr. Ellyn Hack, to review as well.   [place], [place] 05/07/2014 10:28 PM

## 2014-05-17 ENCOUNTER — Ambulatory Visit: Payer: Medicare Other | Admitting: Cardiology

## 2014-05-31 ENCOUNTER — Telehealth (INDEPENDENT_AMBULATORY_CARE_PROVIDER_SITE_OTHER): Payer: Self-pay

## 2014-05-31 ENCOUNTER — Telehealth: Payer: Self-pay | Admitting: Cardiology

## 2014-05-31 NOTE — Telephone Encounter (Signed)
Pt called stating he received call from [place] trying to locate pts pre op labs. Pt states he had labs drawn by Dr Allyson Sabal yesterday and Dr Ellyn Hack on Tuesday of this week. Pt advised I will call [facility] and advise pre admit nurse of this and for them to get results to see if they are adequate for surgery. I lmom for Jan in pre admit advising her of this and to call pt to set up labs to be drawn on Monday if the labs that were done are not acceptable.

## 2014-05-31 NOTE — Telephone Encounter (Signed)
Brad Owen wanted to know what labs were ordered.  Informed Brad Owen - CMP,LIPD ordered but no results are present in the system. She verbalized understanding.

## 2014-05-31 NOTE — Telephone Encounter (Signed)
Melissa was calling to inform us that pt has had labwork drawn on 04/26/14. Anesthesia has ok'd this labwork even though it is over a mth as long as Dr Excell Seltzer is ok with this. Advised Melissa that I would send this to Dr Excell Seltzer to have him review labwork. Melissa states that she will also call Dr Allyson Sabal and Dr Ellyn Hack to see what labs they have available.

## 2014-05-31 NOTE — Telephone Encounter (Signed)
Please call,need to know what lab work he had yesterday?

## 2014-06-04 NOTE — Telephone Encounter (Signed)
No more lab needed by me

## 2014-06-05 ENCOUNTER — Other Ambulatory Visit (INDEPENDENT_AMBULATORY_CARE_PROVIDER_SITE_OTHER): Payer: Self-pay | Admitting: General Surgery

## 2014-06-05 ENCOUNTER — Other Ambulatory Visit (INDEPENDENT_AMBULATORY_CARE_PROVIDER_SITE_OTHER): Payer: Self-pay

## 2014-06-05 DIAGNOSIS — K8066 Calculus of gallbladder and bile duct with acute and chronic cholecystitis without obstruction: Secondary | ICD-10-CM

## 2014-06-05 HISTORY — PX: CHOLECYSTECTOMY: SHX55

## 2014-06-05 MED ORDER — OXYCODONE-ACETAMINOPHEN 5-325 MG PO TABS: 1.0000 | ORAL_TABLET | ORAL | Status: DC | PRN

## 2014-06-06 ENCOUNTER — Other Ambulatory Visit: Payer: Self-pay | Admitting: Cardiology

## 2014-06-07 ENCOUNTER — Other Ambulatory Visit: Payer: Self-pay | Admitting: Cardiology

## 2014-06-07 NOTE — Telephone Encounter (Signed)
Rx was sent to pharmacy electronically. 

## 2014-06-20 ENCOUNTER — Encounter (INDEPENDENT_AMBULATORY_CARE_PROVIDER_SITE_OTHER): Payer: Self-pay | Admitting: General Surgery

## 2014-06-20 ENCOUNTER — Ambulatory Visit (INDEPENDENT_AMBULATORY_CARE_PROVIDER_SITE_OTHER): Payer: Medicare Other | Admitting: General Surgery

## 2014-06-20 VITALS — BP 128/78 | HR 71 | Temp 97.8°F | Resp 16 | Wt 205.2 lb

## 2014-06-20 DIAGNOSIS — Z09 Encounter for follow-up examination after completed treatment for conditions other than malignant neoplasm: Secondary | ICD-10-CM

## 2014-06-20 NOTE — Progress Notes (Signed)
History: Patient returns to the office approximately 2 weeks following laparoscopic cholecystectomy with cholangiogram after an episode of severe right upper quadrant pain and transiently elevated LFTs and lipase. At the time of surgery he had some significant cholecystitis. Operative cholangiogram was normal. He reports he is doing well with no recurrence of his abdominal pain. He has some mild soreness that is resolving. He has had some urgency of bowel movements after eating.  Exam: BP 128/78  Pulse 71  Temp(Src) 97.8 F (36.6 C) (Temporal)  Resp 16  Wt 205 lb 3.2 oz (93.078 kg) General: Appears well Abdomen: Incision is well healed without complication. Soft and nontender.  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  Assessment and plan: Doing well follow up with Dr. Cholecystectomy without cholangiogram. I told him that his urgency of bowel movements is very likely to improve over the next couple of months and he could call me at any point if this is a problem for him.

## 2014-08-07 ENCOUNTER — Ambulatory Visit: Payer: Medicare Other | Admitting: Cardiology

## 2014-10-30 ENCOUNTER — Encounter: Payer: Self-pay | Admitting: Family Medicine

## 2014-11-08 ENCOUNTER — Telehealth: Payer: Self-pay | Admitting: *Deleted

## 2014-11-08 ENCOUNTER — Ambulatory Visit (INDEPENDENT_AMBULATORY_CARE_PROVIDER_SITE_OTHER): Payer: Medicare Other | Admitting: Family Medicine

## 2014-11-08 ENCOUNTER — Other Ambulatory Visit: Payer: Self-pay | Admitting: *Deleted

## 2014-11-08 ENCOUNTER — Encounter: Payer: Self-pay | Admitting: Family Medicine

## 2014-11-08 VITALS — BP 132/76 | HR 78 | Temp 98.5°F | Resp 14 | Ht 70.0 in | Wt 212.0 lb

## 2014-11-08 DIAGNOSIS — Z125 Encounter for screening for malignant neoplasm of prostate: Secondary | ICD-10-CM

## 2014-11-08 DIAGNOSIS — I10 Essential (primary) hypertension: Secondary | ICD-10-CM

## 2014-11-08 DIAGNOSIS — R5382 Chronic fatigue, unspecified: Secondary | ICD-10-CM

## 2014-11-08 DIAGNOSIS — I2510 Atherosclerotic heart disease of native coronary artery without angina pectoris: Secondary | ICD-10-CM

## 2014-11-08 DIAGNOSIS — Z23 Encounter for immunization: Secondary | ICD-10-CM

## 2014-11-08 NOTE — Progress Notes (Signed)
Subjective:    Patient ID: Brad Owen, male    DOB: 1946/09/07, 68 y.o.   MRN: [id number]  HPI Patient is a very pleasant 68 year old white gentleman who presents today for follow-up of his past medical history. He has a history of hypertension. He is currently on metoprolol. He has history of hyperlipidemia and he is currently on simvastatin. He has a history of ASCVD/coronary artery disease status post percutaneous coronary angioplasty. He denies any chest pain shortness of breath or dyspnea on exertion. He is overdue for fasting lipid panel. His goal LDL cholesterol is less than 70. He is also overdue for PSA. Patient is due today for a flu shot as well as Prevnar 13. Past Medical History  Diagnosis Date  . Hypertension   . GERD (gastroesophageal reflux disease)   . Hypercholesteremia   . CAD S/P percutaneous coronary angioplasty 1998    BMS to OM, followed by DES in 2003: PTCA a side branch.; Patent stents in 2005.  [name] History of stress test 01/05/2004    abnormal stress test, positive bruce protocol exercise stress test with scintigraphic evidence of mild lateral wall ischemia at the apex. Dynamic gating reveals a preserved EF at 60% by QGS  . Psoriasis     Patchy psoriasis  . Erectile dysfunction   . Asthma    Past Surgical History  Procedure Laterality Date  . Cardiac surgery    . Coronary stent placement  06/06/1997    PTCA/Stent Placement-LAD A 3.0 x 15 mm [place] Ranger balloon. a 3.0 x 18 mm AVE (GFX) stent, a JL 4 guide catheter and a 300 cm extra-support wire. Because of its proximal location in the LAD, the decision was made to place an [place] stent. the stent we put was placed in position and deployed at 8 atmospheres for 45 seconds.A successful angioplasty and stent placement to a 90% proximal eccentric LAD lesi  . Cardiac catheterization  03/1998    Normal left ventricular systolic function, Minor narrowing in the body of the stent, minor tapering in the distal RCA at the area  of the PDA.  . Cardiac catheterization  12/06/2001    STENTS: At this time the 2.5 x 10 cutting balloon was placed down the OM and a total of four inflations were performed, 10 for 58,12 for 62, 12 for 62, and 12x61. these for inflation at the bifurcation didi not impinge on the ostium of the branch, resulted in brisk distal flow and no evidence of any dissection or thrombus.  . Cardiac catheterization  05/10/2002    STENTS: the 2.5 x 81mm [place] balloon was placed in the ostium of the vessel and 2 inflation, 7 x 65 and 8 x 60 were performed, there was less than 10% area of narrowing in the otium of this vassel and there was always TIMI-3 flow, there was also cutting ballon PTCA of the OM branch that was pinched by the stent.  . Cardiac catheterization  02.2005    Normal left ventricular systolic function, patent stents in both the circumflex and left anterior descending with only minimal in-stent restenosis in the left anterior descending.  . Cholecystectomy  06/05/2014   Current Outpatient Prescriptions on File Prior to Visit  Medication Sig Dispense Refill  . ADVAIR DISKUS 250-50 MCG/DOSE AEPB USE ONE INHALATION TWO TIMES A DAY 3 each 3  . albuterol (PROVENTIL HFA;VENTOLIN HFA) 108 (90 BASE) MCG/ACT inhaler Inhale 2 puffs into the lungs every 6 (six) hours as needed for  wheezing or shortness of breath. 1 Inhaler 5  . aspirin EC 81 MG tablet Take 81 mg by mouth daily.    . clobetasol ointment (TEMOVATE) 0.05 % daily.    . fluticasone (FLONASE) 50 MCG/ACT nasal spray Place 1 spray into the nose daily as needed for allergies (congestion).     [name] HUMIRA PEN 40 MG/0.8ML PNKT     . metoprolol succinate (TOPROL-XL) 25 MG 24 hr tablet TAKE 1 TABLET DAILY 90 tablet 3  . omeprazole (PRILOSEC OTC) 20 MG tablet Take 1 tablet (20 mg total) by mouth daily. 90 tablet 4  . simvastatin (ZOCOR) 20 MG tablet TAKE 1 TABLET EVERY MORNING 90 tablet 3   No current facility-administered medications on file prior to  visit.   No Known Allergies History   Social History  . Marital Status: Married    Spouse Name: N/A    Number of Children: N/A  . Years of Education: N/A   Occupational History  . Not on file.   Social History Main Topics  . Smoking status: Former Research scientist (life sciences)  . Smokeless tobacco: Current User    Types: Chew  . Alcohol Use: Yes     Comment: occassionally  . Drug Use: No  . Sexual Activity: Not on file   Other Topics Concern  . Not on file   Social History Narrative   He is a married father of 2. He has 3 stepchildren, he has 10 grandchildren and 2 great   Grandchildren. He chews tobacco, does not   smoke. He seldom drinks alcohol.    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 He walks well over the golf course without any significant difficulties. Besides this he really does get routine exercise.       Review of Systems  All other systems reviewed and are negative.      Objective:   Physical Exam  Constitutional: He appears well-developed and well-nourished.  Cardiovascular: Normal rate and regular rhythm.   No murmur heard. Pulmonary/Chest: Effort normal and breath sounds normal. No respiratory distress. He has no wheezes. He has no rales.  Abdominal: Soft. Bowel sounds are normal. He exhibits no distension and no mass. There is no tenderness. There is no rebound and no guarding.  Musculoskeletal: He exhibits no edema.  Vitals reviewed.         Assessment & Plan:  Essential hypertension - Plan: COMPLETE METABOLIC PANEL WITH GFR, Lipid panel  ASCVD (arteriosclerotic cardiovascular disease) - Plan: Lipid panel  Prostate cancer screening - Plan: PSA, Medicare  Patient received his flu shot and Prevnar 13 today. He is currently taking an aspirin every day. He is asymptomatic. His blood pressures well controlled. He is overdue for fasting lipid panel.  His goal LDL cholesterol is less than 70. I will also check a PSA when I check his lab work. Follow-up in 6 months or as needed.

## 2014-11-08 NOTE — Telephone Encounter (Signed)
MD made aware and new orders obtained for labs.   Patient currently scheduled to come in for fasting labs on 11/09/2014.  New orders added to labs to be obtained.

## 2014-11-08 NOTE — Telephone Encounter (Signed)
Patient seen in office today for 6 month F/U.   States that he forgot to discuss fatigue with MD. Reports that he constantly feels tired and like he has no energy.   Advised that MD would be made aware.

## 2014-11-08 NOTE — Addendum Note (Signed)
Addended by: Sheral Flow on: 11/08/2014 03:14 PM   Modules accepted: Orders

## 2014-11-09 ENCOUNTER — Other Ambulatory Visit: Payer: Medicare Other

## 2014-11-09 DIAGNOSIS — Z125 Encounter for screening for malignant neoplasm of prostate: Secondary | ICD-10-CM

## 2014-11-09 DIAGNOSIS — R5382 Chronic fatigue, unspecified: Secondary | ICD-10-CM

## 2014-11-09 DIAGNOSIS — I10 Essential (primary) hypertension: Secondary | ICD-10-CM

## 2014-11-09 DIAGNOSIS — I2510 Atherosclerotic heart disease of native coronary artery without angina pectoris: Secondary | ICD-10-CM

## 2014-11-09 DIAGNOSIS — Z23 Encounter for immunization: Secondary | ICD-10-CM

## 2014-11-09 LAB — COMPLETE METABOLIC PANEL WITH GFR
ALK PHOS: 85 U/L (ref 39–117)
ALT: 16 U/L (ref 0–53)
AST: 16 U/L (ref 0–37)
Albumin: 3.6 g/dL (ref 3.5–5.2)
BUN: 13 mg/dL (ref 6–23)
CALCIUM: 8.6 mg/dL (ref 8.4–10.5)
CHLORIDE: 103 meq/L (ref 96–112)
CO2: 27 meq/L (ref 19–32)
Creat: 1.15 mg/dL (ref 0.50–1.35)
GFR, EST AFRICAN AMERICAN: 76 mL/min
GFR, EST NON AFRICAN AMERICAN: 65 mL/min
GLUCOSE: 113 mg/dL — AB (ref 70–99)
POTASSIUM: 4 meq/L (ref 3.5–5.3)
SODIUM: 138 meq/L (ref 135–145)
TOTAL PROTEIN: 6.9 g/dL (ref 6.0–8.3)
Total Bilirubin: 1 mg/dL (ref 0.2–1.2)

## 2014-11-09 LAB — CBC WITH DIFFERENTIAL/PLATELET
BASOS PCT: 1 % (ref 0–1)
Basophils Absolute: 0.1 10*3/uL (ref 0.0–0.1)
Eosinophils Absolute: 1 10*3/uL — ABNORMAL HIGH (ref 0.0–0.7)
Eosinophils Relative: 11 % — ABNORMAL HIGH (ref 0–5)
HEMATOCRIT: 37.9 % — AB (ref 39.0–52.0)
Hemoglobin: 13 g/dL (ref 13.0–17.0)
LYMPHS ABS: 2.9 10*3/uL (ref 0.7–4.0)
Lymphocytes Relative: 33 % (ref 12–46)
MCH: 29.5 pg (ref 26.0–34.0)
MCHC: 34.3 g/dL (ref 30.0–36.0)
MCV: 86.1 fL (ref 78.0–100.0)
MONO ABS: 0.9 10*3/uL (ref 0.1–1.0)
MPV: 9.7 fL (ref 9.4–12.4)
Monocytes Relative: 10 % (ref 3–12)
NEUTROS ABS: 4 10*3/uL (ref 1.7–7.7)
Neutrophils Relative %: 45 % (ref 43–77)
Platelets: 193 10*3/uL (ref 150–400)
RBC: 4.4 MIL/uL (ref 4.22–5.81)
RDW: 13.6 % (ref 11.5–15.5)
WBC: 8.9 10*3/uL (ref 4.0–10.5)

## 2014-11-09 LAB — LIPID PANEL
CHOLESTEROL: 128 mg/dL (ref 0–200)
HDL: 43 mg/dL (ref >39)
LDL Cholesterol: 73 mg/dL (ref 0–99)
Total CHOL/HDL Ratio: 3 Ratio
Triglycerides: 58 mg/dL (ref <150)
VLDL: 12 mg/dL (ref 0–40)

## 2014-11-09 LAB — TSH: TSH: 3.024 u[IU]/mL (ref 0.350–4.500)

## 2014-11-09 NOTE — Telephone Encounter (Signed)
Add tsh, testosterone, cbc

## 2014-11-10 LAB — TESTOSTERONE: TESTOSTERONE: 330 ng/dL (ref 300–890)

## 2014-11-10 LAB — PSA, MEDICARE: PSA: 1.04 ng/mL (ref <=4.00)

## 2014-11-14 ENCOUNTER — Telehealth: Payer: Self-pay | Admitting: Family Medicine

## 2014-11-15 ENCOUNTER — Other Ambulatory Visit: Payer: Self-pay | Admitting: Family Medicine

## 2014-11-15 ENCOUNTER — Other Ambulatory Visit: Payer: Medicare Other

## 2014-11-15 DIAGNOSIS — R5383 Other fatigue: Secondary | ICD-10-CM

## 2014-11-15 DIAGNOSIS — Z1211 Encounter for screening for malignant neoplasm of colon: Secondary | ICD-10-CM

## 2014-11-15 NOTE — Addendum Note (Signed)
Addended by: Daylene Posey T on: 11/15/2014 05:08 PM   Modules accepted: Orders

## 2014-11-16 LAB — FECAL OCCULT BLOOD, IMMUNOCHEMICAL
FECAL OCCULT BLOOD: POSITIVE — AB
FECAL OCCULT BLOOD: POSITIVE — AB
FECAL OCCULT BLOOD: POSITIVE — AB

## 2014-11-16 NOTE — Addendum Note (Signed)
Addended by: WRAY, [place] on: 11/16/2014 08:53 AM   Modules accepted: Orders

## 2014-11-20 ENCOUNTER — Other Ambulatory Visit: Payer: Self-pay | Admitting: Family Medicine

## 2014-11-20 DIAGNOSIS — R195 Other fecal abnormalities: Secondary | ICD-10-CM

## 2014-11-20 DIAGNOSIS — R5382 Chronic fatigue, unspecified: Secondary | ICD-10-CM

## 2014-11-20 NOTE — Progress Notes (Signed)
Pt aware of results and wants to go to [place]

## 2014-11-29 ENCOUNTER — Encounter: Payer: Self-pay | Admitting: Physician Assistant

## 2014-11-29 ENCOUNTER — Ambulatory Visit (INDEPENDENT_AMBULATORY_CARE_PROVIDER_SITE_OTHER): Payer: Medicare Other | Admitting: Physician Assistant

## 2014-11-29 VITALS — BP 122/78 | HR 72 | Ht 69.0 in | Wt 208.0 lb

## 2014-11-29 DIAGNOSIS — K219 Gastro-esophageal reflux disease without esophagitis: Secondary | ICD-10-CM

## 2014-11-29 DIAGNOSIS — R195 Other fecal abnormalities: Secondary | ICD-10-CM

## 2014-11-29 DIAGNOSIS — Z860100 Personal history of colon polyps, unspecified: Secondary | ICD-10-CM

## 2014-11-29 DIAGNOSIS — K921 Melena: Secondary | ICD-10-CM

## 2014-11-29 MED ORDER — MOVIPREP 100 G PO SOLR: 1.0000 | ORAL | Status: DC

## 2014-11-29 NOTE — Progress Notes (Signed)
Patient ID: Brad Owen, male   DOB: [birth year], 68 y.o.   MRN: [id number]   Subjective:    Patient ID: Brad Owen, male    DOB: 06/13/1946, 68 y.o.   MRN: [id number]  HPI Brad Owen is a pleasant 68 year old white male new to GI today referred by Dr. Jenna Luo for evaluation of Hemoccult-positive stool. Patient had undergone a recent physical and did Hemoccults which returned to 3 out of 3 positive for occult blood. Most recent labs on 11/09/2014 showed hemoglobin of 13 hematocrit 37.9 MCV of 86 leaflets 193 BO and 13 creatinine 1.15 LFTs were normal. Patient states that he feels fine. He is status post cholecystectomy in July 2015 and says that his bowels have been more  irregular since that time ,and he frequently has postprandial urgency. He has not had any abdominal discomfort, no nausea ,no heartburn ,indigestion or dysphagia. He has not noted any melena or hematochezia. He is on Prilosec OTC chronically for GERD symptoms. He has no family history of colon cancer or polyps that he is aware of. He relates that he has had 2 prior colonoscopies one done about 7 years ago possibly by Dr. Benson Norway which was negative -he was told he did not have any polyps. He had one prior to that about 15 years ago I believe by Dr. Cristina Gong and also had endoscopy. He says he was told he had 3 polyps at that time. Patient also tells me that he had undergone a workup with Dr. Cristina Gong because of finding of a severe anemia. He required hospitalization with transfusion of 4 units of blood. He did not have any signs of any overt blood loss and says that the endoscopy and colonoscopy were both unrevealing and that Dr. Cristina Gong never figured out why he had lost the blood. He states he was frustrated with not having an answer. He has not had any problems with anemia since that time. He takes a baby aspirin daily, no regular NSAIDs and no blood thinners. Other medical problems include coronary artery disease status post remote stents  2 in 2003 and 2005, and hypertension, hyperlipidemia.  Review of Systems Pertinent positive and negative review of systems were noted in the above HPI section.  All other review of systems was otherwise negative.  Outpatient Encounter Prescriptions as of 11/29/2014  Medication Sig  . ADVAIR DISKUS 250-50 MCG/DOSE AEPB USE ONE INHALATION TWO TIMES A DAY  . albuterol (PROVENTIL HFA;VENTOLIN HFA) 108 (90 BASE) MCG/ACT inhaler Inhale 2 puffs into the lungs every 6 (six) hours as needed for wheezing or shortness of breath.  [name] aspirin EC 81 MG tablet Take 81 mg by mouth daily.  . clobetasol ointment (TEMOVATE) 0.05 % daily.  . fluticasone (FLONASE) 50 MCG/ACT nasal spray Place 1 spray into the nose daily as needed for allergies (congestion).   [name] HUMIRA PEN 40 MG/0.8ML PNKT   . metoprolol succinate (TOPROL-XL) 25 MG 24 hr tablet TAKE 1 TABLET DAILY  . MOVIPREP 100 G SOLR Take 1 kit (200 g total) by mouth as directed.  [name] omeprazole (PRILOSEC OTC) 20 MG tablet Take 1 tablet (20 mg total) by mouth daily.  . simvastatin (ZOCOR) 20 MG tablet TAKE 1 TABLET EVERY MORNING   No Known Allergies Patient Active Problem List   Diagnosis Date Noted  . CAD S/P percutaneous coronary angioplasty   . Hypercholesteremia   . Hypertension   . Erectile dysfunction    History   Social History  . Marital Status:  Married    Spouse Name: N/A    Number of Children: N/A  . Years of Education: N/A   Occupational History  . Not on file.   Social History Main Topics  . Smoking status: Former Research scientist (life sciences)  . Smokeless tobacco: Current User    Types: Chew  . Alcohol Use: 0.0 oz/week    0 Not specified per week     Comment: occassionally  . Drug Use: No  . Sexual Activity: Not on file   Other Topics Concern  . Not on file   Social History Narrative   He is a married father of 2. He has 3 stepchildren, he has 10 grandchildren and 2 great   Grandchildren. He chews tobacco, does not   smoke. He seldom drinks  alcohol.    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 He walks well over the golf course without any significant difficulties. Besides this he really does get routine exercise.     Mr. Nickolson family history includes Diabetes in his father and sister; Heart Problems in his father and mother.      Objective:    Filed Vitals:   11/29/14 0833  BP: 122/78  Pulse: 72    Physical Exam  well-developed white male in no acute distress, pleasant blood pressure 128/78 pulse 72 height 5 foot 9 weight 208. HEENT; nontraumatic normocephalic EOMI PERRLA sclera anicteric, Supple; no JVD, Cardiovascular; regular rate and rhythm with S1-S2 no murmur rub or gallop, Pulmonary; clear bilaterally, Abdomen ;soft nontender nondistended bowel sounds are present no palpable mass or hepatosplenomegaly, Rectal; exam not done recently documented Hemoccult positive with Hemoccult cards on the 70s no clubbing cyanosis or edema skin warm and dry, Psych; mood and affect appropriate       Assessment & Plan:   #1 68 yo male with asymptomatic Hemoccult-positive stool. Hemoglobin and MCV are normal. Etiology of heme positive stool is not clear r-ule out occult lesion #2 remote history of colon polyps #3 remote history of an episode of severe anemia requiring transfusions with negative GI workup approximately 15 years ago #4 coronary artery disease status post stent 2 remote #5 hypertension #6 hyperlipidemia #7 chronic GERD  Plan; patient will continue Prilosec 20 mg by mouth daily Schedule for colonoscopy and EGD with Dr. Fuller Plan. Procedures were discussed in detail with patient and he is agreeable to proceed We will attempt to obtain results of prior endoscopic evaluations.    Amy Genia Harold PA-C 11/29/2014

## 2014-11-29 NOTE — Progress Notes (Signed)
Reviewed and agree with management plan.  Ramonda Galyon T. Carrisa Keller, MD FACG 

## 2014-11-29 NOTE — Patient Instructions (Signed)
You have been scheduled for an endoscopy and colonoscopy. Please follow the written instructions given to you at your visit today. We have given you a colonscopy prep. If you use inhalers (even only as needed), please bring them with you on the day of your procedure.

## 2014-12-05 ENCOUNTER — Encounter: Payer: Self-pay | Admitting: Gastroenterology

## 2015-01-03 ENCOUNTER — Ambulatory Visit (AMBULATORY_SURGERY_CENTER): Payer: Medicare Other | Admitting: Gastroenterology

## 2015-01-03 ENCOUNTER — Encounter: Payer: Self-pay | Admitting: Gastroenterology

## 2015-01-03 VITALS — BP 102/54 | HR 67 | Temp 96.8°F | Resp 15 | Ht 69.0 in | Wt 208.0 lb

## 2015-01-03 DIAGNOSIS — R195 Other fecal abnormalities: Secondary | ICD-10-CM | POA: Diagnosis not present

## 2015-01-03 DIAGNOSIS — K635 Polyp of colon: Secondary | ICD-10-CM | POA: Diagnosis not present

## 2015-01-03 DIAGNOSIS — Z86010 Personal history of colonic polyps: Secondary | ICD-10-CM | POA: Diagnosis not present

## 2015-01-03 DIAGNOSIS — D123 Benign neoplasm of transverse colon: Secondary | ICD-10-CM

## 2015-01-03 DIAGNOSIS — K219 Gastro-esophageal reflux disease without esophagitis: Secondary | ICD-10-CM | POA: Diagnosis not present

## 2015-01-03 DIAGNOSIS — K529 Noninfective gastroenteritis and colitis, unspecified: Secondary | ICD-10-CM | POA: Diagnosis not present

## 2015-01-03 MED ORDER — SODIUM CHLORIDE 0.9 % IV SOLN: 500.0000 mL | INTRAVENOUS | Status: DC

## 2015-01-03 NOTE — Op Note (Signed)
[place]  [address]. [place], [id number]   COLONOSCOPY PROCEDURE REPORT  PATIENT: Brad Owen, Brad Owen  MR#: [id number] BIRTHDATE: 12-Dec-1945 , 69  yrs. old GENDER: male ENDOSCOPIST: Ladene Artist, MD, [facility] REFERRED LP:[id number] Dennard Schaumann, M.D. PROCEDURE DATE:  01/03/2015 PROCEDURE:   Colonoscopy with biopsy First Screening Colonoscopy - Avg.  risk and is 50 yrs.  old or older - No.  Prior Negative Screening - Now for repeat screening. N/A  History of Adenoma - Now for follow-up colonoscopy & has been > or = to 3 yrs.  N/A  Polyps Removed Today? Yes. ASA CLASS:   Class III INDICATIONS:heme-positive stool and surveillance colonoscopy based on a history of adenomatous colonic polyp(s). MEDICATIONS: Monitored anesthesia care and Propofol 150 mg IV DESCRIPTION OF PROCEDURE:   After the risks benefits and alternatives of the procedure were thoroughly explained, informed consent was obtained.  The digital rectal exam revealed no abnormalities of the rectum.   The LB [id number]  endoscope was introduced through the anus and advanced to the cecum, which was identified by both the appendix and ileocecal valve. No adverse events experienced.   The quality of the prep was excellent, using MoviPrep  The instrument was then slowly withdrawn as the colon was fully examined.  COLON FINDINGS: A sessile polyp measuring 4 mm in size was found in the transverse colon.  A polypectomy was performed with cold forceps.  The resection was complete, the polyp tissue was completely retrieved and sent to histology.   There was moderate diverticulosis noted in the descending colon, transverse colon, and ascending colon.   There was moderate diverticulosis noted in the sigmoid colon with associated inflammatory changes, muscular hypertrophy and petechiae. Multiple biopsies were obtained.  The examination was otherwise normal.  Retroflexed views revealed internal Grade I hemorrhoids. The  time to cecum=1 minutes 49 seconds.  Withdrawal time=9 minutes 21 seconds.  The scope was withdrawn and the procedure completed. COMPLICATIONS: There were no immediate complications.  ENDOSCOPIC IMPRESSION: 1.   Sessile polyp in the transverse colon; polypectomy was performed with cold forceps 2.   Moderate diverticulosis in the descending colon, transverse colon, and ascending colon 3.   Moderate diverticulosis with associated colitis in the sigmoid colon 4.   Grade l internal hemorrhoids  RECOMMENDATIONS: 1.  Await pathology results 2.  High fiber diet with liberal fluid intake. 3.  Call to schedule a follow-up appointment in office for 1 montheSigned:  Ladene Artist, MD, [facility] 01/03/2015 10:58 AM

## 2015-01-03 NOTE — Op Note (Signed)
[place]  [address]. [place], [id number]   ENDOSCOPY PROCEDURE REPORT  PATIENT: Brad Owen, Brad Owen  MR#: [id number] BIRTHDATE: July 24, 1946 , 69  yrs. old GENDER: male ENDOSCOPIST: Ladene Artist, MD, [facility] REFERRED BY:  Jenna Luo, M.D. PROCEDURE DATE:  01/03/2015 PROCEDURE:  EGD w/ biopsy ASA CLASS:     Class III INDICATIONS:  history of esophageal reflux and hemocult positive stool. MEDICATIONS: Monitored anesthesia care, Residual sedation present, and Propofol 100 mg IV TOPICAL ANESTHETIC: none DESCRIPTION OF PROCEDURE: After the risks benefits and alternatives of the procedure were thoroughly explained, informed consent was obtained.  The LB [id number] endoscope was introduced through the mouth and advanced to the second portion of the duodenum , Without limitations.  The instrument was slowly withdrawn as the mucosa was fully examined.    ESOPHAGUS: There was LA Class A esophagitis (One or more mucosal breaks < 5 mm in maximal length) noted. STOMACH: Gastritis, mild, was found in the gastric body.  Multiple biopsies were performed.   The stomach otherwise appeared normal.  DUODENUM: The duodenal mucosa showed no abnormalities in the bulb and 2nd part of the duodenum.  Retroflexed views revealed a hiatal hernia.   The scope was then withdrawn from the patient and the procedure completed.  COMPLICATIONS: There were no immediate complications.  ENDOSCOPIC IMPRESSION: 1.   LA Class A esophagitis 2.   Gastritis in the gastric body; multiple biopsies performed 3.   Small hiatal hernia  RECOMMENDATIONS: 1.  Anti-reflux regimen long term 2.  Await pathology results 3.  Continue PPI daily long term  eSigned:  Ladene Artist, MD, [facility] 01/03/2015 11:02 AM

## 2015-01-03 NOTE — Patient Instructions (Signed)
Impressions/recommendations:  Esophagitis (handout given) Gastritis (handout given) Hiatal hernia (handout given)  Polyps (handout given) Hemorrhoids (handout given) Diverticulosis (handout given) High fiber diet (handout given)  YOU HAD AN ENDOSCOPIC PROCEDURE TODAY AT [facility]: Refer to the procedure report that was given to you for any specific questions about what was found during the examination.  If the procedure report does not answer your questions, please call your gastroenterologist to clarify.  If you requested that your care partner not be given the details of your procedure findings, then the procedure report has been included in a sealed envelope for you to review at your convenience later.  YOU SHOULD EXPECT: Some feelings of bloating in the abdomen. Passage of more gas than usual.  Walking can help get rid of the air that was put into your GI tract during the procedure and reduce the bloating. If you had a lower endoscopy (such as a colonoscopy or flexible sigmoidoscopy) you may notice spotting of blood in your stool or on the toilet paper. If you underwent a bowel prep for your procedure, then you may not have a normal bowel movement for a few days.  DIET: Your first meal following the procedure should be a light meal and then it is ok to progress to your normal diet.  A half-sandwich or bowl of soup is an example of a good first meal.  Heavy or fried foods are harder to digest and may make you feel nauseous or bloated.  Likewise meals heavy in dairy and vegetables can cause extra gas to form and this can also increase the bloating.  Drink plenty of fluids but you should avoid alcoholic beverages for 24 hours.  ACTIVITY: Your care partner should take you home directly after the procedure.  You should plan to take it easy, moving slowly for the rest of the day.  You can resume normal activity the day after the procedure however you should NOT DRIVE or use heavy  machinery for 24 hours (because of the sedation medicines used during the test).    SYMPTOMS TO REPORT IMMEDIATELY: A gastroenterologist can be reached at any hour.  During normal business hours, 8:30 AM to 5:00 PM Monday through Friday, call [phone].  After hours and on weekends, please call the GI answering service at [phone] who will take a message and have the physician on call contact you.   Following lower endoscopy (colonoscopy or flexible sigmoidoscopy):  Excessive amounts of blood in the stool  Significant tenderness or worsening of abdominal pains  Swelling of the abdomen that is new, acute  Fever of 100F or higher  Following upper endoscopy (EGD)  Vomiting of blood or coffee ground material  New chest pain or pain under the shoulder blades  Painful or persistently difficult swallowing  New shortness of breath  Fever of 100F or higher  Black, tarry-looking stools  FOLLOW UP: If any biopsies were taken you will be contacted by phone or by letter within the next 1-3 weeks.  Call your gastroenterologist if you have not heard about the biopsies in 3 weeks.  Our staff will call the home number listed on your records the next business day following your procedure to check on you and address any questions or concerns that you may have at that time regarding the information given to you following your procedure. This is a courtesy call and so if there is no answer at the home number and we have not heard  from you through the emergency physician on call, we will assume that you have returned to your regular daily activities without incident.  SIGNATURES/CONFIDENTIALITY: You and/or your care partner have signed paperwork which will be entered into your electronic medical record.  These signatures attest to the fact that that the information above on your After Visit Summary has been reviewed and is understood.  Full responsibility of the confidentiality of this discharge  information lies with you and/or your care-partner.

## 2015-01-03 NOTE — Progress Notes (Signed)
Called to room to assist during endoscopic procedure.  Patient ID and intended procedure confirmed with present staff. Received instructions for my participation in the procedure from the performing physician.  

## 2015-01-03 NOTE — Progress Notes (Signed)
Report to PACU, RN, vss, BBS= Clear.  

## 2015-01-04 ENCOUNTER — Telehealth: Payer: Self-pay

## 2015-01-04 NOTE — Telephone Encounter (Signed)
  Follow up Call-  Call back number 01/03/2015  Post procedure Call Back phone  # [phone]  Permission to leave phone message Yes     Patient questions:  Do you have a fever, pain , or abdominal swelling? No. Pain Score  0 *  Have you tolerated food without any problems? Yes.    Have you been able to return to your normal activities? Yes.    Do you have any questions about your discharge instructions: Diet   No. Medications  No. Follow up visit  No.  Do you have questions or concerns about your Care? No.  Actions: * If pain score is 4 or above: No action needed, pain <4.

## 2015-01-10 ENCOUNTER — Encounter: Payer: Self-pay | Admitting: Gastroenterology

## 2015-01-16 ENCOUNTER — Other Ambulatory Visit: Payer: Self-pay | Admitting: Family Medicine

## 2015-01-16 MED ORDER — OMEPRAZOLE MAGNESIUM 20 MG PO TBEC: 20.0000 mg | DELAYED_RELEASE_TABLET | Freq: Every day | ORAL | Status: DC

## 2015-02-11 ENCOUNTER — Telehealth: Payer: Self-pay | Admitting: Family Medicine

## 2015-02-11 NOTE — Telephone Encounter (Signed)
Patient is calling to speak to you about his bill [phone], he says he has already paid the 15.00 balance

## 2015-02-12 NOTE — Telephone Encounter (Signed)
Patient is correct it looks as if we sent him out a bill on the 6th of March and he paid his bill on the 7th of March. I have left message with wife Letta Median in reference to the patient being correct. She said that she had knew he had paid it but wasn't sure where the mix up was, I advised her it more than likely crossed in the mail.  She is going to give patient the message.

## 2015-03-04 ENCOUNTER — Telehealth: Payer: Self-pay | Admitting: Family Medicine

## 2015-03-04 NOTE — Telephone Encounter (Signed)
[phone]   Patient is calling to talk to you regarding getting a med like viagra at at 76 cents per pill would like to know if you can give him any info about this

## 2015-03-06 NOTE — Telephone Encounter (Signed)
LMTRC

## 2015-03-08 NOTE — Telephone Encounter (Signed)
Pt has found a pharm discount place and would like to know if it would be ok for him to use Sildenafil?

## 2015-03-11 NOTE — Telephone Encounter (Signed)
I am fine with him using 50-100 mg of sildenafil as needed for ed.

## 2015-03-12 NOTE — Telephone Encounter (Signed)
Patient aware and will call back for rx

## 2015-05-08 ENCOUNTER — Other Ambulatory Visit: Payer: Self-pay | Admitting: Cardiology

## 2015-05-08 NOTE — Telephone Encounter (Signed)
Rx(s) sent to pharmacy electronically.  

## 2015-05-10 ENCOUNTER — Other Ambulatory Visit: Payer: Self-pay | Admitting: Cardiology

## 2015-05-10 NOTE — Telephone Encounter (Signed)
MED REFILLED  

## 2015-05-15 ENCOUNTER — Other Ambulatory Visit: Payer: Self-pay | Admitting: Dermatology

## 2015-06-18 ENCOUNTER — Ambulatory Visit (INDEPENDENT_AMBULATORY_CARE_PROVIDER_SITE_OTHER): Payer: Medicare Other | Admitting: Family Medicine

## 2015-06-18 ENCOUNTER — Encounter: Payer: Self-pay | Admitting: Family Medicine

## 2015-06-18 VITALS — BP 122/64 | HR 72 | Temp 99.0°F | Resp 18 | Ht 69.0 in | Wt 204.0 lb

## 2015-06-18 DIAGNOSIS — J441 Chronic obstructive pulmonary disease with (acute) exacerbation: Secondary | ICD-10-CM | POA: Diagnosis not present

## 2015-06-18 DIAGNOSIS — J0100 Acute maxillary sinusitis, unspecified: Secondary | ICD-10-CM

## 2015-06-18 MED ORDER — FLUTICASONE-SALMETEROL 250-50 MCG/DOSE IN AEPB: 1.0000 | INHALATION_SPRAY | Freq: Two times a day (BID) | RESPIRATORY_TRACT | Status: DC

## 2015-06-18 MED ORDER — ALBUTEROL SULFATE HFA 108 (90 BASE) MCG/ACT IN AERS: 2.0000 | INHALATION_SPRAY | Freq: Four times a day (QID) | RESPIRATORY_TRACT | Status: AC | PRN

## 2015-06-18 MED ORDER — AZITHROMYCIN 250 MG PO TABS: ORAL_TABLET | ORAL | Status: DC

## 2015-06-18 MED ORDER — PREDNISONE 20 MG PO TABS: 40.0000 mg | ORAL_TABLET | Freq: Every day | ORAL | Status: DC

## 2015-06-18 MED ORDER — FLUTICASONE PROPIONATE 50 MCG/ACT NA SUSP: 2.0000 | Freq: Every day | NASAL | Status: DC | PRN

## 2015-06-18 NOTE — Progress Notes (Signed)
Patient ID: Brad Owen, male   DOB: 1946/03/27, 69 y.o.   MRN: [id number]   Subjective:    Patient ID: Brad Owen, male    DOB: [birth year], 69 y.o.   MRN: [id number]  Patient presents for Illness patient here with illness for the past 5 days. He has history of COPD however he has not been using his Advair and albuterol very regularly. He has a remote history of smoking. He has had cough with production of thick yellow sputum he is also had sinus pressure and drainage with thick yellow sputum. He has history of sinus disease and is status post sinus surgery in the past. He admits to subjective fever this weekend he's also had some body aches. No known sick contacts. He has been able to work through his symptoms. At nighttime he has worsening episodes of wheezing but he has not used his albuterol. He has not taken any over-the-counter medications.   Review Of Systems:  GEN- denies fatigue, fever, weight loss,weakness, recent illness HEENT- denies eye drainage, change in vision, nasal discharge, CVS- denies chest pain, palpitations RESP- + SOB, +cough, wheeze ABD- denies N/V, change in stools, abd pain GU- denies dysuria, hematuria, dribbling, incontinence MSK- denies joint pain, muscle aches, injury Neuro- denies headache, dizziness, syncope, seizure activity       Objective:    BP 122/64 mmHg  Pulse 72  Temp(Src) 99 F (37.2 C) (Oral)  Resp 18  Ht 5\' 9"  (1.753 m)  Wt 204 lb (92.534 kg)  BMI 30.11 kg/m2  SpO2 96% GEN- NAD, alert and oriented x3 HEENT- PERRL, EOMI, non injected sclera, pink conjunctiva, MMM, oropharynx clear  TM clear bilat no effusion,  + maxillary sinus tenderness, inflammed turbinates,  Nasal drainage  Neck- Supple, no LAD CVS- RRR, no murmur RESP-mild rhonchi upper airway, few scattered , wheeze, normal WOB EXT- No edema Pulses- Radial 2+         Assessment & Plan:      Problem List Items Addressed This Visit    None    Visit Diagnoses    Acute maxillary sinusitis, recurrence not specified    -  Primary    Antibiotics, flonase, mucinex. Add prenisone and albuterol for the COPD exacerbation, likley triggered by viral illness. but high risk for decompensation. Advised to use advair as prescribed. 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 Any transcriptional errors that result from this process are unintentional.

## 2015-06-18 NOTE — Patient Instructions (Signed)
Mucinex DM  Or robitussin DM  Take antibiotics, prednisone as prescribed  Use albuterol inhaler as needed Flonase for nasal drip  F/U as needed

## 2015-06-24 ENCOUNTER — Telehealth: Payer: Self-pay | Admitting: Family Medicine

## 2015-06-24 NOTE — Telephone Encounter (Signed)
He was seen for COPD exacerbation, is it just cough lingering  or is he SOB/WHeezing? Did he take the zpak and all of the steroids?  Is he using his inhalers- he was not at the visit

## 2015-06-24 NOTE — Telephone Encounter (Signed)
Felt much better until this weekend.  Now has same thing back.  Can you call something back for him?

## 2015-06-24 NOTE — Telephone Encounter (Signed)
Yes he did tell me he had taken the antibiotics and steroids.  Left message for him to call back.

## 2015-06-25 MED ORDER — GUAIFENESIN-CODEINE 100-10 MG/5ML PO SOLN: 5.0000 mL | Freq: Four times a day (QID) | ORAL | Status: DC | PRN

## 2015-06-25 MED ORDER — PREDNISONE 10 MG PO TABS: ORAL_TABLET | ORAL | Status: DC

## 2015-06-25 NOTE — Telephone Encounter (Signed)
Calling you back this morning

## 2015-06-25 NOTE — Telephone Encounter (Signed)
Pt called with provider recommendations and new Rxs to pharmacy

## 2015-06-25 NOTE — Telephone Encounter (Signed)
All same symptoms have returned.  Wife became sick from him and now feels she has given it back to him.

## 2015-06-25 NOTE — Telephone Encounter (Signed)
Continue inhalers send over prednisone taker 40mg  x 3, 20mg x3, 10mg  x3 days Send over robitussin AC if able to tolerate codiene

## 2015-08-06 ENCOUNTER — Other Ambulatory Visit: Payer: Self-pay | Admitting: Dermatology

## 2015-08-07 ENCOUNTER — Other Ambulatory Visit: Payer: Self-pay | Admitting: Cardiology

## 2015-08-13 ENCOUNTER — Ambulatory Visit (INDEPENDENT_AMBULATORY_CARE_PROVIDER_SITE_OTHER): Payer: Medicare Other | Admitting: Cardiology

## 2015-08-13 ENCOUNTER — Encounter: Payer: Self-pay | Admitting: Cardiology

## 2015-08-13 VITALS — BP 120/56 | HR 72 | Ht 69.0 in | Wt 208.4 lb

## 2015-08-13 DIAGNOSIS — Z9861 Coronary angioplasty status: Secondary | ICD-10-CM

## 2015-08-13 DIAGNOSIS — E7800 Pure hypercholesterolemia, unspecified: Secondary | ICD-10-CM

## 2015-08-13 DIAGNOSIS — R079 Chest pain, unspecified: Secondary | ICD-10-CM

## 2015-08-13 DIAGNOSIS — R0609 Other forms of dyspnea: Secondary | ICD-10-CM | POA: Diagnosis not present

## 2015-08-13 DIAGNOSIS — I10 Essential (primary) hypertension: Secondary | ICD-10-CM | POA: Diagnosis not present

## 2015-08-13 DIAGNOSIS — I2510 Atherosclerotic heart disease of native coronary artery without angina pectoris: Secondary | ICD-10-CM | POA: Diagnosis not present

## 2015-08-13 DIAGNOSIS — Z79899 Other long term (current) drug therapy: Secondary | ICD-10-CM

## 2015-08-13 DIAGNOSIS — R0600 Dyspnea, unspecified: Secondary | ICD-10-CM

## 2015-08-13 NOTE — Progress Notes (Signed)
PCP: [name], MD  Clinic Note: Chief Complaint  Patient presents with  . Annual Exam    pt c/o SOB, leg swelling. no chest pain  . Coronary Artery Disease  . COPD    HPI: Brad Owen is a 69 y.o. male with a PMH below who presents today for what amounts to be annual followup for his CAD/PCI with multiple cardiac risk factors. He is patient of Dr. Aldona Bar, I first saw him back in July of 2014.  He is a history of CAD dating back to 1998 when he had coronary angioplasty with BMS placement. His last PCI was in 2003 with a DES to the OM. Repeat 2005 showed patent stents. He has not had a stress test or Since then.  Brad Owen was last seen on Judith 20/15 by Ellen Henri, PA-C., but I hadn't seen him since July 2014 as a second visit with me. Last year he was seen for preop evaluation for laparoscopic cholecystectomy.  Recent Hospitalizations: last summer for his cholecystectomy that were relatively well without any serious complications  Cholecystectomy last year.  Studies Reviewed: no new studies  Interval History: [name] presents today with no worsening exertional dyspnea. He had an episode last week or so when he had some discomfort on the previous believes early in the morning. It then radiated to the left face and left thigh with some numbness there. He did feel short of breath and just near syncope with this. It lasted maybe 10 minutes and went away. It was not exertional. He has noted that he does get more short of breath with exertion he than usual. He occasionally has noticed some chest tightness and pressure arm or his exertion of walking on the golf course. He does not think he feels like his prior anginal symptoms, but now is that it was quite a while since he had any anginal symptoms. He denies any significant PND or orthopnea but has had some episodes of orthopnea which overnight. He does have some mild swelling in his ankles many of the day but it is  better by the morning. He has noticed an increasing heart rates that last about 30 seconds.  Usually these occurred near the end of the day and are not necessarily enough to make him feel lightheaded or dizzy. 1 he does notice that he tends to be more tired than usual at the end of the day than he used to be. 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 This is definitely cut back his level of exertion. With his COPD he has chronic morning cough is productive of thick phlegm. He thinks it is waking up short of breath is probably related to back them heart there as he doesn't have PND.  He denies any syncope/near syncope or TIA/amaurosis fugax symptoms. No melena, hematochezia, hematuria, or epstaxis. No claudication.   Past Medical History  Diagnosis Date  . Hypertension   . GERD (gastroesophageal reflux disease)   . Hypercholesteremia   . CAD S/P percutaneous coronary angioplasty 1998    BMS to OM, followed by DES in 2003: PTCA a side branch.; Patent stents in 2005.  [name] History of stress test 01/05/2004    abnormal stress test, positive bruce protocol exercise stress test with scintigraphic evidence of mild lateral wall ischemia at the apex. Dynamic gating reveals a preserved EF at 60% by QGS  . Psoriasis     Patchy psoriasis  .  Erectile dysfunction   . Asthma   . Psoriatic arthritis     hips    Past Surgical History  Procedure Laterality Date  . Cardiac surgery    . Coronary stent placement  06/06/1997    PTCA/Stent Placement-LAD A 3.0 x 15 mm Cane [place] balloon. a 3.0 x 18 mm AVE (GFX) stent, a JL 4 guide catheter and a 300 cm extra-support wire. Because of its proximal location in the LAD, the decision was made to place an [place] stent. the stent we put was placed in position and deployed at 8 atmospheres for 45 seconds.A successful angioplasty and stent placement to a 90% proximal eccentric LAD lesi  . Cardiac catheterization  03/1998    Normal left ventricular  systolic function, Minor narrowing in the body of the stent, minor tapering in the distal RCA at the area of the PDA.  . Cardiac catheterization  12/06/2001    STENTS: At this time the 2.5 x 10 cutting balloon was placed down the OM and a total of four inflations were performed, 10 for 58,12 for 62, 12 for 62, and 12x61. these for inflation at the bifurcation didi not impinge on the ostium of the branch, resulted in brisk distal flow and no evidence of any dissection or thrombus.  . Cardiac catheterization  05/10/2002    STENTS: the 2.5 x 12mm [place] balloon was placed in the ostium of the vessel and 2 inflation, 7 x 65 and 8 x 60 were performed, there was less than 10% area of narrowing in the otium of this vassel and there was always TIMI-3 flow, there was also cutting ballon PTCA of the OM branch that was pinched by the stent.  . Cardiac catheterization  02.2005    Normal left ventricular systolic function, patent stents in both the circumflex and left anterior descending with only minimal in-stent restenosis in the left anterior descending.  . Cholecystectomy  06/05/2014    ROS: A comprehensive was performed. Review of Systems  Constitutional: Positive for weight loss (Gets tired easily again today. Falls asleep easily near the end of the day, but does not have daytime sleepiness).  HENT: Positive for congestion.   Respiratory: Positive for cough (Chronic cough without significant production of thick phlegm), shortness of breath and wheezing.   Cardiovascular: Negative for claudication.       Per history of present illness  Gastrointestinal: Negative for heartburn.  Genitourinary: Negative for dysuria.  Musculoskeletal: Positive for back pain and joint pain (He hurt his right shoulder; psoriatic arthritis in both knees and hip).       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 Allyson Sabal. Plan is to go  back for deeper margin  Neurological:       See history of present illness  Endo/Heme/Allergies: Bruises/bleeds easily.  Psychiatric/Behavioral: Negative.   All other systems reviewed and are negative.   Prior to Admission medications   Medication Sig Start Date End Date Taking? Authorizing Provider  Adalimumab (HUMIRA PEN) 40 MG/0.8ML PNKT Inject 40 mg into the skin daily.   Yes Historical Provider, MD  albuterol (PROVENTIL HFA;VENTOLIN HFA) 108 (90 BASE) MCG/ACT inhaler Inhale 2 puffs into the lungs every 6 (six) hours as needed for wheezing or shortness of breath. 06/18/15  Yes Alycia Rossetti, MD  aspirin EC 81 MG tablet Take 81 mg by mouth daily.  Yes Historical Provider, MD  clobetasol ointment (TEMOVATE) 7.12 % Apply 1 application topically 2 (two) times daily.   Yes Historical Provider, MD  fluticasone (FLONASE) 50 MCG/ACT nasal spray Place 2 sprays into both nostrils daily as needed for allergies (congestion). 06/18/15  Yes Alycia Rossetti, MD  Fluticasone-Salmeterol (ADVAIR DISKUS) 250-50 MCG/DOSE AEPB Inhale 1 puff into the lungs 2 (two) times daily. 06/18/15  Yes Alycia Rossetti, MD  guaiFENesin-codeine 100-10 MG/5ML syrup Take 5 mLs by mouth every 6 (six) hours as needed for cough. 06/25/15  Yes Alycia Rossetti, MD  metoprolol succinate (TOPROL-XL) 25 MG 24 hr tablet Take 1 tablet (25 mg total) by mouth daily. PLEASE MAKE APPOINTMENT FOR REFILLS 05/10/15  Yes Leonie Man, MD  omeprazole (PRILOSEC OTC) 20 MG tablet Take 1 tablet (20 mg total) by mouth daily. 01/16/15  Yes Susy Frizzle, MD  simvastatin (ZOCOR) 20 MG tablet Take 1 tablet (20 mg total) by mouth every morning. Patient needs to contact office for addtional refills 05/08/15  Yes Leonie Man, MD   No Known Allergies   Social History   Social History  . Marital Status: Married    Spouse Name: N/A  . Number of Children: N/A  . Years of Education: N/A   Social History Main Topics  . Smoking status: Former  Research scientist (life sciences)  . Smokeless tobacco: Current User    Types: Chew  . Alcohol Use: 0.0 oz/week    0 Standard drinks or equivalent per week     Comment: occassionally  . Drug Use: No  . Sexual Activity: Not Asked   Other Topics Concern  . None   Social History Narrative   He is a married father of 2. He has 3 stepchildren, he has 10 grandchildren and 2 great   Grandchildren. He chews tobacco, does not   smoke. He seldom drinks alcohol.    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 He walks well over the golf course without any significant difficulties. Besides this he really does get routine exercise.   -- he works for early morning to midafternoon. Sleeps from 10 PM to 4:30 AM  Family History  Problem Relation Age of Onset  . Heart Problems Mother   . Diabetes Father   . Heart Problems Father   . Diabetes Sister     Wt Readings from Last 3 Encounters:  08/13/15 208 lb 6.4 oz (94.53 kg)  06/18/15 204 lb (92.534 kg)  01/03/15 208 lb (94.348 kg)    PHYSICAL EXAM BP 120/56 mmHg  Pulse 72  Ht 5\' 9"  (1.753 m)  Wt 208 lb 6.4 oz (94.53 kg)  BMI 30.76 kg/m2 General: He is a pleasant, healthy appearing gentleman in no acute distress, alert and oriented x3, answers questions appropriately. He is well-groomed and well-nourished. He has a well-groomed beard. Normal mood and affect.  HEENT: NCAT, EOMI, MMM. Anicteric sclerae.  Neck: Supple, no LAN, JVD or carotid bruit.  Heart: RRR, normal S1, S2, occasional ectopy. No M/R/G. Nondisplaced PMI.  Lungs: CTAB, nonlabored, normal effort, good air movement.  Abdomen: Soft/NT/ND/NABS. No HSM.  Extremities: No C/C/E, patches of psoriasis on both of his thighs and knees. strength, normal gait Neuro: grossly intact.   Adult ECG Report  Rate: 72 ;  Rhythm: normal sinus rhythm and Normal axis, intervals and durations.;   Narrative  Interpretation: normal EKG   Other studies Reviewed: Additional studies/ records that were reviewed today include:  Recent Labs:  Due for followup lipids Lab Results  Component Value Date   CHOL 128 11/09/2014   HDL 43 11/09/2014   LDLCALC 73 11/09/2014   TRIG 58 11/09/2014   CHOLHDL 3.0 11/09/2014    ASSESSMENT / PLAN: Problem List Items Addressed This Visit    CAD S/P percutaneous coronary angioplasty - Primary (Chronic)    Time, he been stable, but now he is noticing worsening exertional dyspnea with some chest tightness. Is quite likely that his dyspnea is related to COPD, cannot exclude ischemia. We have not had an evaluation since 2005. Plan: Lexiscan Myoview -- he would not be able to walk on treadmill because of his knees. He is not actively wheezing therefore Lexiscan is okay.  He is on beta blocker, statin, aspirin on stable doses.      Relevant Orders   EKG 12-Lead (Completed)   Myocardial Perfusion Imaging   Lipid panel   Comprehensive metabolic panel   Chest pain with moderate risk for cardiac etiology    Both the episode began the shoulder blades radiating to the left-sided space and the tightness in his chest with exertion associated with dyspnea are concerning for least moderate risk of cardiac etiology in this gentleman with with known coronary disease.  Plan: Lexiscan Myoview      Relevant Orders   EKG 12-Lead (Completed)   Myocardial Perfusion Imaging   Lipid panel   Comprehensive metabolic panel   DOE (dyspnea on exertion) (Chronic)    Probably multifactorial with COPD being the baseline component, however notes worsening symptoms with occasional chest tightness need to exclude ischemic etiology. Plan is for [employer].      Relevant Orders   EKG 12-Lead (Completed)   Myocardial Perfusion Imaging   Lipid panel   Comprehensive metabolic panel   Essential hypertension (Chronic)    Well-controlled on beta blocker. He has had orthostatic  symptoms in the past therefore I would not shave for much more aggressive control. No room to add additional medication such as ARB or ACE inhibitor.      Relevant Orders   EKG 12-Lead (Completed)   Myocardial Perfusion Imaging   Lipid panel   Comprehensive metabolic panel   Hypercholesteremia (Chronic)    On simvastatin. Last labs are pretty well controlled. Plan is for him to have his lipids and chemistries checked when he comes in for a stress test.      Relevant Orders   EKG 12-Lead (Completed)   Myocardial Perfusion Imaging   Lipid panel   Comprehensive metabolic panel    Other Visit Diagnoses    Polypharmacy        Relevant Orders    Lipid panel    Comprehensive metabolic panel       Current medicines are reviewed at length with the patient today. (+/- concerns) n/a The following changes have been made: n/a Studies Ordered:   Orders Placed This Encounter  Procedures  . Lipid panel  . Comprehensive metabolic panel  . Myocardial Perfusion Imaging  . EKG 12-Lead   No medication changes.   ROV in 6 months unless stress test is abnormal.   Webster Patrone, Leonie Green, M.D., M.S. Interventional Cardiologist   Pager # [phone]

## 2015-08-13 NOTE — Patient Instructions (Signed)
Your physician has requested that you have a lexiscan myoview. For further information please visit [email or web address]. Please follow instruction sheet, as given.  NO OTHER CHANGES TO MEDICATION   Your physician recommends that you schedule a follow-up appointment in  [place].

## 2015-08-14 ENCOUNTER — Encounter: Payer: Self-pay | Admitting: Cardiology

## 2015-08-14 DIAGNOSIS — R0600 Dyspnea, unspecified: Secondary | ICD-10-CM | POA: Insufficient documentation

## 2015-08-14 DIAGNOSIS — R0609 Other forms of dyspnea: Secondary | ICD-10-CM

## 2015-08-14 DIAGNOSIS — I25118 Atherosclerotic heart disease of native coronary artery with other forms of angina pectoris: Secondary | ICD-10-CM | POA: Insufficient documentation

## 2015-08-14 NOTE — Assessment & Plan Note (Signed)
Both the episode began the shoulder blades radiating to the left-sided space and the tightness in his chest with exertion associated with dyspnea are concerning for least moderate risk of cardiac etiology in this gentleman with with known coronary disease.  Plan: [employer]

## 2015-08-14 NOTE — Assessment & Plan Note (Signed)
Time, he been stable, but now he is noticing worsening exertional dyspnea with some chest tightness. Is quite likely that his dyspnea is related to COPD, cannot exclude ischemia. We have not had an evaluation since 2005. Plan: Lexiscan Myoview -- he would not be able to walk on treadmill because of his knees. He is not actively wheezing therefore Lexiscan is okay.  He is on beta blocker, statin, aspirin on stable doses.

## 2015-08-14 NOTE — Assessment & Plan Note (Signed)
Probably multifactorial with COPD being the baseline component, however notes worsening symptoms with occasional chest tightness need to exclude ischemic etiology. Plan is for [employer].

## 2015-08-14 NOTE — Assessment & Plan Note (Signed)
On simvastatin. Last labs are pretty well controlled. Plan is for him to have his lipids and chemistries checked when he comes in for a stress test.

## 2015-08-14 NOTE — Assessment & Plan Note (Signed)
Well-controlled on beta blocker. He has had orthostatic symptoms in the past therefore I would not shave for much more aggressive control. No room to add additional medication such as ARB or ACE inhibitor.

## 2015-08-15 ENCOUNTER — Telehealth (HOSPITAL_COMMUNITY): Payer: Self-pay

## 2015-08-15 NOTE — Telephone Encounter (Signed)
Encounter complete. 

## 2015-08-16 ENCOUNTER — Telehealth (HOSPITAL_COMMUNITY): Payer: Self-pay

## 2015-08-16 NOTE — Telephone Encounter (Signed)
Encounter complete. 

## 2015-08-20 ENCOUNTER — Ambulatory Visit (HOSPITAL_COMMUNITY)
Admission: RE | Admit: 2015-08-20 | Discharge: 2015-08-20 | Disposition: A | Discharge status: Home / Self Care | Payer: Medicare Other | Source: Ambulatory Visit | Attending: Cardiovascular Disease | Admitting: Cardiovascular Disease

## 2015-08-20 DIAGNOSIS — Z8249 Family history of ischemic heart disease and other diseases of the circulatory system: Secondary | ICD-10-CM | POA: Insufficient documentation

## 2015-08-20 DIAGNOSIS — R0602 Shortness of breath: Secondary | ICD-10-CM | POA: Insufficient documentation

## 2015-08-20 DIAGNOSIS — E7800 Pure hypercholesterolemia, unspecified: Secondary | ICD-10-CM

## 2015-08-20 DIAGNOSIS — E785 Hyperlipidemia, unspecified: Secondary | ICD-10-CM | POA: Insufficient documentation

## 2015-08-20 DIAGNOSIS — Z9861 Coronary angioplasty status: Secondary | ICD-10-CM | POA: Insufficient documentation

## 2015-08-20 DIAGNOSIS — F17200 Nicotine dependence, unspecified, uncomplicated: Secondary | ICD-10-CM | POA: Insufficient documentation

## 2015-08-20 DIAGNOSIS — I10 Essential (primary) hypertension: Secondary | ICD-10-CM

## 2015-08-20 DIAGNOSIS — R0609 Other forms of dyspnea: Secondary | ICD-10-CM | POA: Diagnosis not present

## 2015-08-20 DIAGNOSIS — R079 Chest pain, unspecified: Secondary | ICD-10-CM | POA: Insufficient documentation

## 2015-08-20 DIAGNOSIS — I2510 Atherosclerotic heart disease of native coronary artery without angina pectoris: Secondary | ICD-10-CM | POA: Diagnosis not present

## 2015-08-20 DIAGNOSIS — R0600 Dyspnea, unspecified: Secondary | ICD-10-CM

## 2015-08-20 LAB — MYOCARDIAL PERFUSION IMAGING
CHL CUP NUCLEAR SRS: 0
CHL CUP NUCLEAR SSS: 2
LV dias vol: 103 mL
LV sys vol: 43 mL
Peak HR: 98 {beats}/min
Rest HR: 68 {beats}/min
SDS: 2
TID: 1.14

## 2015-08-20 MED ORDER — TECHNETIUM TC 99M SESTAMIBI GENERIC - CARDIOLITE
31.0000 | Freq: Once | INTRAVENOUS | Status: AC | PRN
  Administered 2015-08-20: 31 via INTRAVENOUS

## 2015-08-20 MED ORDER — TECHNETIUM TC 99M SESTAMIBI GENERIC - CARDIOLITE
10.9000 | Freq: Once | INTRAVENOUS | Status: AC | PRN
  Administered 2015-08-20: 10.9 via INTRAVENOUS

## 2015-08-20 MED ORDER — REGADENOSON 0.4 MG/5ML IV SOLN
0.4000 mg | Freq: Once | INTRAVENOUS | Status: AC
  Administered 2015-08-20: 0.4 mg via INTRAVENOUS

## 2015-08-20 NOTE — Progress Notes (Signed)
Quick Note:  Stress Test looked good!! No sign of significant Heart Artery Disease. Pump function is normal.  Good news!!.  HARDING,DAVID W, MD  ______ 

## 2015-08-21 ENCOUNTER — Telehealth: Payer: Self-pay | Admitting: *Deleted

## 2015-08-21 LAB — COMPREHENSIVE METABOLIC PANEL
ALBUMIN: 3.8 g/dL (ref 3.6–5.1)
ALK PHOS: 73 U/L (ref 40–115)
ALT: 22 U/L (ref 9–46)
AST: 19 U/L (ref 10–35)
BUN: 15 mg/dL (ref 7–25)
CALCIUM: 9.2 mg/dL (ref 8.6–10.3)
CO2: 30 mmol/L (ref 20–31)
Chloride: 101 mmol/L (ref 98–110)
Creat: 0.98 mg/dL (ref 0.70–1.25)
GLUCOSE: 100 mg/dL — AB (ref 65–99)
POTASSIUM: 3.9 mmol/L (ref 3.5–5.3)
Sodium: 137 mmol/L (ref 135–146)
Total Bilirubin: 1.3 mg/dL — ABNORMAL HIGH (ref 0.2–1.2)
Total Protein: 7.2 g/dL (ref 6.1–8.1)

## 2015-08-21 LAB — LIPID PANEL
Cholesterol: 142 mg/dL (ref 125–200)
HDL: 51 mg/dL (ref >=40)
LDL Cholesterol: 81 mg/dL (ref <130)
TRIGLYCERIDES: 51 mg/dL (ref <150)
Total CHOL/HDL Ratio: 2.8 Ratio (ref <=5.0)
VLDL: 10 mg/dL (ref <30)

## 2015-08-21 NOTE — Telephone Encounter (Signed)
-----   Message from Leonie Man, MD sent at 08/20/2015  5:37 PM EDT ----- Stress Test looked good!! No sign of significant Heart Artery Disease.  Pump function is normal.  Good news!!.  Leonie Man, MD

## 2015-08-21 NOTE — Telephone Encounter (Signed)
Spoke to patient. Result given . Verbalized understanding  

## 2015-08-26 ENCOUNTER — Telehealth: Payer: Self-pay | Admitting: Cardiology

## 2015-08-26 NOTE — Telephone Encounter (Signed)
Brad Owen is calling to get the results of his lab test . Please call   Thanks

## 2015-08-26 NOTE — Telephone Encounter (Signed)
-----   Message from Leonie Man, MD sent at 08/26/2015  6:11 PM EDT ----- Overall, the lipid panel is roughly the same. Numbers across the board are just a bit higher --> this may have something to do with change in diet. Otherwise relatively stable. Chemistry panel shows relatively normal kidney and liver function. Sugars are borderline -- glucose of 100.  Leonie Man, MD

## 2015-08-26 NOTE — Telephone Encounter (Signed)
Brad Owen is calling to get the results of his lab work .[name] Please  call

## 2015-08-26 NOTE — Telephone Encounter (Signed)
Left message to call back [phone]/281/6

## 2015-08-26 NOTE — Telephone Encounter (Signed)
Returned call to patient lab results not available.Advised I will send message to [place].

## 2015-08-27 ENCOUNTER — Encounter: Payer: Self-pay | Admitting: Gastroenterology

## 2015-08-27 NOTE — Telephone Encounter (Signed)
Error

## 2015-08-28 NOTE — Telephone Encounter (Signed)
Results given to patient. Message sent to Dr. Dennard Schaumann at patient request.

## 2015-08-28 NOTE — Telephone Encounter (Signed)
Pt is calling in to get his results from his blood work   Thanks

## 2015-09-04 ENCOUNTER — Other Ambulatory Visit: Payer: Self-pay | Admitting: Dermatology

## 2015-10-17 ENCOUNTER — Telehealth: Payer: Self-pay | Admitting: Family Medicine

## 2015-10-17 NOTE — Telephone Encounter (Signed)
Spoke with pt's wife and informed her that pt needs to schedule appt with [place] to discuss issues with asthma and allergie before can refer to allergist d/t hasn't been seen since 07/16, pt is scheduled for nov 28

## 2015-10-17 NOTE — Telephone Encounter (Signed)
Patient would like referral to dr bobbit allergist is possible [phone]

## 2015-10-28 ENCOUNTER — Ambulatory Visit (INDEPENDENT_AMBULATORY_CARE_PROVIDER_SITE_OTHER): Payer: Medicare Other | Admitting: Family Medicine

## 2015-10-28 ENCOUNTER — Encounter: Payer: Self-pay | Admitting: Family Medicine

## 2015-10-28 VITALS — BP 122/64 | HR 74 | Temp 98.1°F | Resp 18 | Wt 211.0 lb

## 2015-10-28 DIAGNOSIS — J438 Other emphysema: Secondary | ICD-10-CM

## 2015-10-28 DIAGNOSIS — Z23 Encounter for immunization: Secondary | ICD-10-CM | POA: Diagnosis not present

## 2015-10-28 NOTE — Addendum Note (Signed)
Addended by: Shary Decamp B on: 10/28/2015 04:48 PM   Modules accepted: Orders

## 2015-10-28 NOTE — Progress Notes (Signed)
Subjective:    Patient ID: Brad Owen, male    DOB: Oct 30, 1946, 69 y.o.   MRN: [id number]  HPI Has a long-standing history of asthma.  However he also has a 40-pack-year history of smoking. He has been on Advair as a preventative for quite some time. Over last several months he reports increasing shortness of breath, increasing wheezing, increasing dyspnea on exertion. He is having uses albuterol 2 puffs every night to help him sleep. He recently had a stress test that was negative for ischemia. He denies any hemoptysis. He denies any chest pain. Past Medical History  Diagnosis Date  . Hypertension   . GERD (gastroesophageal reflux disease)   . Hypercholesteremia   . CAD S/P percutaneous coronary angioplasty 1998    BMS to OM, followed by DES in 2003: PTCA a side branch.; Patent stents in 2005.  [name] History of stress test 01/05/2004    abnormal stress test, positive bruce protocol exercise stress test with scintigraphic evidence of mild lateral wall ischemia at the apex. Dynamic gating reveals a preserved EF at 60% by QGS  . Psoriasis     Patchy psoriasis  . Erectile dysfunction   . Asthma   . Psoriatic arthritis     hips   Past Surgical History  Procedure Laterality Date  . Cardiac surgery    . Coronary stent placement  06/06/1997    PTCA/Stent Placement-LAD A 3.0 x 15 mm [place] Ranger balloon. a 3.0 x 18 mm AVE (GFX) stent, a JL 4 guide catheter and a 300 cm extra-support wire. Because of its proximal location in the LAD, the decision was made to place an [place] stent. the stent we put was placed in position and deployed at 8 atmospheres for 45 seconds.A successful angioplasty and stent placement to a 90% proximal eccentric LAD lesi  . Cardiac catheterization  03/1998    Normal left ventricular systolic function, Minor narrowing in the body of the stent, minor tapering in the distal RCA at the area of the PDA.  . Cardiac catheterization  12/06/2001    STENTS: At this time the 2.5 x 10  cutting balloon was placed down the OM and a total of four inflations were performed, 10 for 58,12 for 62, 12 for 62, and 12x61. these for inflation at the bifurcation didi not impinge on the ostium of the branch, resulted in brisk distal flow and no evidence of any dissection or thrombus.  . Cardiac catheterization  05/10/2002    STENTS: the 2.5 x 39mm CrossSail balloon was placed in the ostium of the vessel and 2 inflation, 7 x 65 and 8 x 60 were performed, there was less than 10% area of narrowing in the otium of this vassel and there was always TIMI-3 flow, there was also cutting ballon PTCA of the OM branch that was pinched by the stent.  . Cardiac catheterization  02.2005    Normal left ventricular systolic function, patent stents in both the circumflex and left anterior descending with only minimal in-stent restenosis in the left anterior descending.  . Cholecystectomy  06/05/2014   Current Outpatient Prescriptions on File Prior to Visit  Medication Sig Dispense Refill  . Adalimumab (HUMIRA PEN) 40 MG/0.8ML PNKT Inject 40 mg into the skin daily.    [name] albuterol (PROVENTIL HFA;VENTOLIN HFA) 108 (90 BASE) MCG/ACT inhaler Inhale 2 puffs into the lungs every 6 (six) hours as needed for wheezing or shortness of breath. 1 Inhaler 5  . aspirin EC 81  MG tablet Take 81 mg by mouth daily.    . clobetasol ointment (TEMOVATE) AB-123456789 % Apply 1 application topically 2 (two) times daily.    . fluticasone (FLONASE) 50 MCG/ACT nasal spray Place 2 sprays into both nostrils daily as needed for allergies (congestion). 16 g 3  . Fluticasone-Salmeterol (ADVAIR DISKUS) 250-50 MCG/DOSE AEPB Inhale 1 puff into the lungs 2 (two) times daily. 3 each 3  . guaiFENesin-codeine 100-10 MG/5ML syrup Take 5 mLs by mouth every 6 (six) hours as needed for cough. 120 mL 0  . metoprolol succinate (TOPROL-XL) 25 MG 24 hr tablet Take 1 tablet (25 mg total) by mouth daily. PLEASE MAKE APPOINTMENT FOR REFILLS 90 tablet 0  . omeprazole  (PRILOSEC OTC) 20 MG tablet Take 1 tablet (20 mg total) by mouth daily. 90 tablet 4  . simvastatin (ZOCOR) 20 MG tablet Take 1 tablet (20 mg total) by mouth every morning. Patient needs to contact office for addtional refills 90 tablet 1   No current facility-administered medications on file prior to visit.   No Known Allergies Social History   Social History  . Marital Status: Married    Spouse Name: N/A  . Number of Children: N/A  . Years of Education: N/A   Occupational History  . Not on file.   Social History Main Topics  . Smoking status: Former Research scientist (life sciences)  . Smokeless tobacco: Current User    Types: Chew  . Alcohol Use: 0.0 oz/week    0 Standard drinks or equivalent per week     Comment: occassionally  . Drug Use: No  . Sexual Activity: Not on file   Other Topics Concern  . Not on file   Social History Narrative   He is a married father of 2. He has 3 stepchildren, he has 10 grandchildren and 2 great   Grandchildren. He chews tobacco, does not   smoke. He seldom drinks alcohol.    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 He walks well over the golf course without any significant difficulties. Besides this he really does get routine exercise.       Review of Systems  All other systems reviewed and are negative.      Objective:   Physical Exam  Constitutional: He appears well-developed and well-nourished.  Cardiovascular: Normal rate and regular rhythm.   No murmur heard. Pulmonary/Chest: Effort normal and breath sounds normal. No respiratory distress. He has no wheezes. He has no rales.  Abdominal: Soft. Bowel sounds are normal. He exhibits no distension and no mass. There is no tenderness. There is no rebound and no guarding.  Musculoskeletal: He exhibits no edema.  Vitals reviewed.         Assessment & Plan:  Other emphysema ([place]) - Plan:  DG Chest 2 View  I suspect the patient has underlying emphysema. I would like the patient to get a chest x-ray to rule out structural pulmonary problems that can cause shortness of breath such as pulmonary malignancy.  Discontinue Advair and replaced with Symbicort 160/4.5, 2 puffs inhaled twice a day. Add incruse 1 inhalation a day. Recheck in 2 weeks. Obtain pulmonary function test at that time

## 2015-10-29 ENCOUNTER — Ambulatory Visit
Admission: RE | Admit: 2015-10-29 | Discharge: 2015-10-29 | Disposition: A | Discharge status: Home / Self Care | Payer: Medicare Other | Source: Ambulatory Visit | Attending: Family Medicine | Admitting: Family Medicine

## 2015-10-29 DIAGNOSIS — J438 Other emphysema: Secondary | ICD-10-CM

## 2015-11-03 ENCOUNTER — Other Ambulatory Visit: Payer: Self-pay | Admitting: Cardiology

## 2015-11-04 NOTE — Telephone Encounter (Signed)
REFILL 

## 2015-11-08 ENCOUNTER — Other Ambulatory Visit: Payer: Self-pay | Admitting: Cardiology

## 2015-11-08 MED ORDER — METOPROLOL SUCCINATE ER 25 MG PO TB24: 25.0000 mg | ORAL_TABLET | Freq: Every day | ORAL | Status: DC

## 2015-11-08 NOTE — Telephone Encounter (Signed)
°*  STAT* If patient is at the pharmacy, call can be transferred to refill team.   1. Which medications need to be refilled? (please list name of each medication and dose if known) Metoprolol Succinate   2. Which pharmacy/location (including street and city if local pharmacy) is medication to be sent to? [facility] [address]    3. Do they need a 30 day or 90 day supply? 90 and a partial 14 day supply   Pt is completely out

## 2015-11-08 NOTE — Telephone Encounter (Signed)
Rx send to pt pharmacy.[name]

## 2015-11-11 ENCOUNTER — Other Ambulatory Visit: Payer: Self-pay

## 2015-11-11 ENCOUNTER — Ambulatory Visit (INDEPENDENT_AMBULATORY_CARE_PROVIDER_SITE_OTHER): Payer: BC Managed Care – PPO | Admitting: Family Medicine

## 2015-11-11 ENCOUNTER — Encounter: Payer: Self-pay | Admitting: Family Medicine

## 2015-11-11 VITALS — BP 130/62 | HR 76 | Temp 98.3°F | Resp 18 | Wt 216.0 lb

## 2015-11-11 DIAGNOSIS — R0609 Other forms of dyspnea: Secondary | ICD-10-CM

## 2015-11-11 DIAGNOSIS — J438 Other emphysema: Secondary | ICD-10-CM

## 2015-11-11 DIAGNOSIS — R0600 Dyspnea, unspecified: Secondary | ICD-10-CM

## 2015-11-11 MED ORDER — UMECLIDINIUM BROMIDE 62.5 MCG/INH IN AEPB: 1.0000 | INHALATION_SPRAY | Freq: Every day | RESPIRATORY_TRACT | Status: DC

## 2015-11-11 MED ORDER — METOPROLOL SUCCINATE ER 25 MG PO TB24: 25.0000 mg | ORAL_TABLET | Freq: Every day | ORAL | Status: DC

## 2015-11-11 MED ORDER — BUDESONIDE-FORMOTEROL FUMARATE 160-4.5 MCG/ACT IN AERO: 2.0000 | INHALATION_SPRAY | Freq: Two times a day (BID) | RESPIRATORY_TRACT | Status: DC

## 2015-11-11 MED ORDER — BUDESONIDE-FORMOTEROL FUMARATE 160-4.5 MCG/ACT IN AERO: 2.0000 | INHALATION_SPRAY | Freq: Two times a day (BID) | RESPIRATORY_TRACT | Status: AC

## 2015-11-11 NOTE — Progress Notes (Signed)
Subjective:    Patient ID: Brad Owen, male    DOB: 1946/11/06, 69 y.o.   MRN: [id number]  HPI  10/28/15  Has a long-standing history of asthma.  However he also has a 40-pack-year history of smoking. He has been on Advair as a preventative for quite some time. Over last several months he reports increasing shortness of breath, increasing wheezing, increasing dyspnea on exertion. He is having uses albuterol 2 puffs every night to help him sleep. He recently had a stress test that was negative for ischemia. He denies any hemoptysis. He denies any chest pain. At that time, my plan was: I suspect the patient has underlying emphysema. I would like the patient to get a chest x-ray to rule out structural pulmonary problems that can cause shortness of breath such as pulmonary malignancy.  Discontinue Advair and replaced with Symbicort 160/4.5, 2 puffs inhaled twice a day. Add incruse 1 inhalation a day. Recheck in 2 weeks. Obtain pulmonary function test at that time  11/11/15 CXR was clear.   Patient states he feels approximately 40% better on the combination of Symbicort and incruse.   His dyspnea on exertion is much better. He denies any chest pain. He denies any cough or hemoptysis. He would like to continue these medications and discontinue Advair. One thing I did not think to check last time as a CBC to monitor for anemia is another potential cause of his dyspnea. Past Medical History  Diagnosis Date  . Hypertension   . GERD (gastroesophageal reflux disease)   . Hypercholesteremia   . CAD S/P percutaneous coronary angioplasty 1998    BMS to OM, followed by DES in 2003: PTCA a side branch.; Patent stents in 2005.  [name] History of stress test 01/05/2004    abnormal stress test, positive bruce protocol exercise stress test with scintigraphic evidence of mild lateral wall ischemia at the apex. Dynamic gating reveals a preserved EF at 60% by QGS  . Psoriasis     Patchy psoriasis  . Erectile  dysfunction   . Asthma   . Psoriatic arthritis ([facility])     hips  . COPD (chronic obstructive pulmonary disease) [facility])    Past Surgical History  Procedure Laterality Date  . Cardiac surgery    . Coronary stent placement  06/06/1997    PTCA/Stent Placement-LAD A 3.0 x 15 mm [place] Ranger balloon. a 3.0 x 18 mm AVE (GFX) stent, a JL 4 guide catheter and a 300 cm extra-support wire. Because of its proximal location in the LAD, the decision was made to place an [place] stent. the stent we put was placed in position and deployed at 8 atmospheres for 45 seconds.A successful angioplasty and stent placement to a 90% proximal eccentric LAD lesi  . Cardiac catheterization  03/1998    Normal left ventricular systolic function, Minor narrowing in the body of the stent, minor tapering in the distal RCA at the area of the PDA.  . Cardiac catheterization  12/06/2001    STENTS: At this time the 2.5 x 10 cutting balloon was placed down the OM and a total of four inflations were performed, 10 for 58,12 for 62, 12 for 62, and 12x61. these for inflation at the bifurcation didi not impinge on the ostium of the branch, resulted in brisk distal flow and no evidence of any dissection or thrombus.  . Cardiac catheterization  05/10/2002    STENTS: the 2.5 x 77mm CrossSail balloon was placed in the ostium of  the vessel and 2 inflation, 7 x 65 and 8 x 60 were performed, there was less than 10% area of narrowing in the otium of this vassel and there was always TIMI-3 flow, there was also cutting ballon PTCA of the OM branch that was pinched by the stent.  . Cardiac catheterization  02.2005    Normal left ventricular systolic function, patent stents in both the circumflex and left anterior descending with only minimal in-stent restenosis in the left anterior descending.  . Cholecystectomy  06/05/2014   Current Outpatient Prescriptions on File Prior to Visit  Medication Sig Dispense Refill  . Adalimumab (HUMIRA PEN) 40 MG/0.8ML PNKT  Inject 40 mg into the skin daily.    [name] albuterol (PROVENTIL HFA;VENTOLIN HFA) 108 (90 BASE) MCG/ACT inhaler Inhale 2 puffs into the lungs every 6 (six) hours as needed for wheezing or shortness of breath. 1 Inhaler 5  . aspirin EC 81 MG tablet Take 81 mg by mouth daily.    . clobetasol ointment (TEMOVATE) AB-123456789 % Apply 1 application topically 2 (two) times daily.    . fluticasone (FLONASE) 50 MCG/ACT nasal spray Place 2 sprays into both nostrils daily as needed for allergies (congestion). 16 g 3  . Fluticasone-Salmeterol (ADVAIR DISKUS) 250-50 MCG/DOSE AEPB Inhale 1 puff into the lungs 2 (two) times daily. 3 each 3  . meloxicam (MOBIC) 15 MG tablet Take 15 mg by mouth daily.  1  . metoprolol succinate (TOPROL-XL) 25 MG 24 hr tablet Take 1 tablet (25 mg total) by mouth daily. PLEASE MAKE APPOINTMENT FOR REFILLS 30 tablet 0  . omeprazole (PRILOSEC OTC) 20 MG tablet Take 1 tablet (20 mg total) by mouth daily. 90 tablet 4  . simvastatin (ZOCOR) 20 MG tablet TAKE 1 TABLET EVERY MORNING (PATIENT NEEDS TO CONTACT OFFICE FOR ADDITIONAL REFILLS) 90 tablet 1   No current facility-administered medications on file prior to visit.   No Known Allergies Social History   Social History  . Marital Status: Married    Spouse Name: N/A  . Number of Children: N/A  . Years of Education: N/A   Occupational History  . Not on file.   Social History Main Topics  . Smoking status: Former Research scientist (life sciences)  . Smokeless tobacco: Current User    Types: Chew  . Alcohol Use: 0.0 oz/week    0 Standard drinks or equivalent per week     Comment: occassionally  . Drug Use: No  . Sexual Activity: Not on file   Other Topics Concern  . Not on file   Social History Narrative   He is a married father of 2. He has 3 stepchildren, he has 10 grandchildren and 2 great   Grandchildren. He chews tobacco, does not   smoke. He seldom drinks alcohol.    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 He walks well over the golf course without any significant difficulties. Besides this he really does get routine exercise.       Review of Systems  All other systems reviewed and are negative.      Objective:   Physical Exam  Constitutional: He appears well-developed and well-nourished.  Cardiovascular: Normal rate and regular rhythm.   No murmur heard. Pulmonary/Chest: Effort normal and breath sounds normal. No respiratory distress. He has no wheezes. He has no rales.  Abdominal: Soft. Bowel sounds are normal.  He exhibits no distension and no mass. There is no tenderness. There is no rebound and no guarding.  Musculoskeletal: He exhibits no edema.  Vitals reviewed.         Assessment & Plan:  Other emphysema ([place]) - Plan: CBC with Differential/Platelet, Umeclidinium Bromide (INCRUSE ELLIPTA) 62.5 MCG/INH AEPB, budesonide-formoterol (SYMBICORT) 160-4.5 MCG/ACT inhaler, DISCONTINUED: budesonide-formoterol (SYMBICORT) 160-4.5 MCG/ACT inhaler, DISCONTINUED: Umeclidinium Bromide (INCRUSE ELLIPTA) 62.5 MCG/INH AEPB  Dyspnea on exertion - Plan: CBC with Differential/Platelet   much better on the combination of Symbicort and incruse.  Continue these medications indefinitely for COPD. Discontinue Advair. I will check a CBC to monitor for anemia. Patient will check on the cost of these medications. If they're too expensive , we may need to make some adjustments such as Spiriva.

## 2015-11-12 LAB — CBC WITH DIFFERENTIAL/PLATELET
BASOS ABS: 0.1 10*3/uL (ref 0.0–0.1)
BASOS PCT: 1 % (ref 0–1)
Eosinophils Absolute: 0.6 10*3/uL (ref 0.0–0.7)
Eosinophils Relative: 6 % — ABNORMAL HIGH (ref 0–5)
HCT: 37 % — ABNORMAL LOW (ref 39.0–52.0)
HEMOGLOBIN: 12.3 g/dL — AB (ref 13.0–17.0)
Lymphocytes Relative: 34 % (ref 12–46)
Lymphs Abs: 3.2 10*3/uL (ref 0.7–4.0)
MCH: 29.3 pg (ref 26.0–34.0)
MCHC: 33.2 g/dL (ref 30.0–36.0)
MCV: 88.1 fL (ref 78.0–100.0)
MONOS PCT: 8 % (ref 3–12)
MPV: 12.4 fL (ref 8.6–12.4)
Monocytes Absolute: 0.8 10*3/uL (ref 0.1–1.0)
NEUTROS ABS: 4.8 10*3/uL (ref 1.7–7.7)
NEUTROS PCT: 51 % (ref 43–77)
Platelets: 190 10*3/uL (ref 150–400)
RBC: 4.2 MIL/uL — AB (ref 4.22–5.81)
RDW: 13.4 % (ref 11.5–15.5)
WBC: 9.5 10*3/uL (ref 4.0–10.5)

## 2015-12-04 ENCOUNTER — Other Ambulatory Visit: Payer: Self-pay | Admitting: Cardiology

## 2015-12-04 MED ORDER — METOPROLOL SUCCINATE ER 25 MG PO TB24: 25.0000 mg | ORAL_TABLET | Freq: Every day | ORAL | Status: DC

## 2016-01-15 ENCOUNTER — Other Ambulatory Visit: Payer: Self-pay | Admitting: *Deleted

## 2016-01-15 MED ORDER — METOPROLOL SUCCINATE ER 25 MG PO TB24: 25.0000 mg | ORAL_TABLET | Freq: Every day | ORAL | Status: DC

## 2016-01-30 DIAGNOSIS — M2550 Pain in unspecified joint: Secondary | ICD-10-CM | POA: Diagnosis not present

## 2016-01-30 DIAGNOSIS — L401 Generalized pustular psoriasis: Secondary | ICD-10-CM | POA: Diagnosis not present

## 2016-01-30 DIAGNOSIS — N182 Chronic kidney disease, stage 2 (mild): Secondary | ICD-10-CM | POA: Diagnosis not present

## 2016-01-30 DIAGNOSIS — M150 Primary generalized (osteo)arthritis: Secondary | ICD-10-CM | POA: Diagnosis not present

## 2016-01-31 ENCOUNTER — Telehealth: Payer: Self-pay | Admitting: Family Medicine

## 2016-01-31 NOTE — Telephone Encounter (Signed)
Patient is calling to ask some questions regarding his medications, would not be specific about which ones  [phone]

## 2016-01-31 NOTE — Telephone Encounter (Signed)
[facility] with wife

## 2016-02-03 MED ORDER — PANTOPRAZOLE SODIUM 40 MG PO TBEC: 40.0000 mg | DELAYED_RELEASE_TABLET | Freq: Every day | ORAL | Status: DC

## 2016-02-03 NOTE — Telephone Encounter (Signed)
Ins will no longer cover omeprazole ok to change to protonix?

## 2016-02-03 NOTE — Telephone Encounter (Signed)
Medication called/sent to requested pharmacy  

## 2016-02-03 NOTE — Telephone Encounter (Signed)
Ok 40 mg poqday

## 2016-02-06 DIAGNOSIS — M25551 Pain in right hip: Secondary | ICD-10-CM | POA: Diagnosis not present

## 2016-02-06 DIAGNOSIS — M25552 Pain in left hip: Secondary | ICD-10-CM | POA: Diagnosis not present

## 2016-02-13 ENCOUNTER — Other Ambulatory Visit: Payer: Self-pay | Admitting: Cardiology

## 2016-02-13 NOTE — Telephone Encounter (Signed)
REFILL 

## 2016-02-18 ENCOUNTER — Telehealth: Payer: Self-pay | Admitting: Family Medicine

## 2016-02-18 NOTE — Telephone Encounter (Signed)
Patient is calling to talk to you about not being able to get his symbicort when he went to the pharmacy  [phone]

## 2016-02-19 NOTE — Telephone Encounter (Signed)
LMTRC

## 2016-02-24 NOTE — Telephone Encounter (Signed)
Spoke to pt and need to do PA for symbicort - in meantime will give samples. Pt is aware and picked up samples.

## 2016-02-26 NOTE — Telephone Encounter (Signed)
PA submitted.   Patient has tried and failed Albuterol, Advair, and Incruse Ellipta.   Dx: J44.9- COPD.

## 2016-02-27 NOTE — Telephone Encounter (Signed)
Received PA determination.   PA approved 02/26/2016- 02/25/2017.  Pharmacy made aware.

## 2016-02-28 DIAGNOSIS — N400 Enlarged prostate without lower urinary tract symptoms: Secondary | ICD-10-CM | POA: Diagnosis not present

## 2016-02-28 DIAGNOSIS — N419 Inflammatory disease of prostate, unspecified: Secondary | ICD-10-CM | POA: Diagnosis not present

## 2016-03-09 ENCOUNTER — Other Ambulatory Visit: Payer: Self-pay | Admitting: Family Medicine

## 2016-03-09 MED ORDER — PANTOPRAZOLE SODIUM 40 MG PO TBEC: 40.0000 mg | DELAYED_RELEASE_TABLET | Freq: Every day | ORAL | Status: DC

## 2016-03-09 NOTE — Telephone Encounter (Signed)
,  meddone

## 2016-03-23 DIAGNOSIS — K136 Irritative hyperplasia of oral mucosa: Secondary | ICD-10-CM | POA: Diagnosis not present

## 2016-03-23 DIAGNOSIS — K130 Diseases of lips: Secondary | ICD-10-CM | POA: Diagnosis not present

## 2016-03-23 DIAGNOSIS — F17220 Nicotine dependence, chewing tobacco, uncomplicated: Secondary | ICD-10-CM | POA: Diagnosis not present

## 2016-03-24 DIAGNOSIS — Z720 Tobacco use: Secondary | ICD-10-CM | POA: Insufficient documentation

## 2016-03-24 DIAGNOSIS — K130 Diseases of lips: Secondary | ICD-10-CM | POA: Insufficient documentation

## 2016-04-22 ENCOUNTER — Other Ambulatory Visit: Payer: Self-pay | Admitting: Family Medicine

## 2016-04-22 ENCOUNTER — Other Ambulatory Visit: Payer: Self-pay | Admitting: Cardiology

## 2016-04-22 MED ORDER — METOPROLOL SUCCINATE ER 25 MG PO TB24: 25.0000 mg | ORAL_TABLET | Freq: Every day | ORAL | Status: DC

## 2016-04-22 MED ORDER — PANTOPRAZOLE SODIUM 40 MG PO TBEC: 40.0000 mg | DELAYED_RELEASE_TABLET | Freq: Every day | ORAL | Status: DC

## 2016-04-22 NOTE — Telephone Encounter (Signed)
Rx(s) sent to pharmacy electronically.  

## 2016-04-23 DIAGNOSIS — M25552 Pain in left hip: Secondary | ICD-10-CM | POA: Diagnosis not present

## 2016-04-23 DIAGNOSIS — M25551 Pain in right hip: Secondary | ICD-10-CM | POA: Diagnosis not present

## 2016-04-29 DIAGNOSIS — L400 Psoriasis vulgaris: Secondary | ICD-10-CM | POA: Diagnosis not present

## 2016-07-13 ENCOUNTER — Other Ambulatory Visit: Payer: Self-pay | Admitting: Cardiology

## 2016-07-17 ENCOUNTER — Ambulatory Visit (INDEPENDENT_AMBULATORY_CARE_PROVIDER_SITE_OTHER): Payer: BC Managed Care – PPO | Admitting: Family Medicine

## 2016-07-17 VITALS — BP 128/74 | HR 82 | Temp 98.1°F | Resp 18 | Wt 209.0 lb

## 2016-07-17 DIAGNOSIS — M1000 Idiopathic gout, unspecified site: Secondary | ICD-10-CM | POA: Diagnosis not present

## 2016-07-17 LAB — BASIC METABOLIC PANEL WITH GFR
BUN: 15 mg/dL (ref 7–25)
CALCIUM: 8.5 mg/dL — AB (ref 8.6–10.3)
CHLORIDE: 105 mmol/L (ref 98–110)
CO2: 25 mmol/L (ref 20–31)
Creat: 1.04 mg/dL (ref 0.70–1.25)
GFR, EST NON AFRICAN AMERICAN: 73 mL/min (ref >=60)
GFR, Est African American: 84 mL/min (ref >=60)
GLUCOSE: 125 mg/dL — AB (ref 70–99)
Potassium: 3.8 mmol/L (ref 3.5–5.3)
Sodium: 139 mmol/L (ref 135–146)

## 2016-07-17 LAB — URIC ACID: Uric Acid, Serum: 5.3 mg/dL (ref 4.0–8.0)

## 2016-07-17 MED ORDER — PREDNISONE 20 MG PO TABS: ORAL_TABLET | ORAL | 0 refills | Status: DC

## 2016-07-17 NOTE — Addendum Note (Signed)
Addended by: Shary Decamp B on: 07/17/2016 03:03 PM   Modules accepted: Orders

## 2016-07-17 NOTE — Progress Notes (Signed)
Subjective:    Patient ID: Brad Owen, male    DOB: 1946/06/17, 70 y.o.   MRN: [id number]  HPI Patient presents with a sudden onset of severe right wrist pain. The wrist is pink warm and tender. Patient states it hurts to even touch the skin. He denies any falls or trauma. He has been eating shrimp recently. He denies any puncture wounds to the wrist. There is mild swelling around the wrist. Past Medical History:  Diagnosis Date  . Asthma   . CAD S/P percutaneous coronary angioplasty 1998   BMS to OM, followed by DES in 2003: PTCA a side branch.; Patent stents in 2005.  [name] COPD (chronic obstructive pulmonary disease) ([place])   . Erectile dysfunction   . GERD (gastroesophageal reflux disease)   . History of stress test 01/05/2004   abnormal stress test, positive bruce protocol exercise stress test with scintigraphic evidence of mild lateral wall ischemia at the apex. Dynamic gating reveals a preserved EF at 60% by QGS  . Hypercholesteremia   . Hypertension   . Psoriasis    Patchy psoriasis  . Psoriatic arthritis ([place])    hips   Past Surgical History:  Procedure Laterality Date  . CARDIAC CATHETERIZATION  03/1998   Normal left ventricular systolic function, Minor narrowing in the body of the stent, minor tapering in the distal RCA at the area of the PDA.  [name] CARDIAC CATHETERIZATION  12/06/2001   STENTS: At this time the 2.5 x 10 cutting balloon was placed down the OM and a total of four inflations were performed, 10 for 58,12 for 62, 12 for 62, and 12x61. these for inflation at the bifurcation didi not impinge on the ostium of the branch, resulted in brisk distal flow and no evidence of any dissection or thrombus.  [name] CARDIAC CATHETERIZATION  05/10/2002   STENTS: the 2.5 x 56mm CrossSail balloon was placed in the ostium of the vessel and 2 inflation, 7 x 65 and 8 x 60 were performed, there was less than 10% area of narrowing in the otium of this vassel and there was always TIMI-3 flow,  there was also cutting ballon PTCA of the OM branch that was pinched by the stent.  [name] CARDIAC CATHETERIZATION  02.2005   Normal left ventricular systolic function, patent stents in both the circumflex and left anterior descending with only minimal in-stent restenosis in the left anterior descending.  [name] CARDIAC SURGERY    . CHOLECYSTECTOMY  06/05/2014  . CORONARY STENT PLACEMENT  06/06/1997   PTCA/Stent Placement-LAD A 3.0 x 15 mm [place] Ranger balloon. a 3.0 x 18 mm AVE (GFX) stent, a JL 4 guide catheter and a 300 cm extra-support wire. Because of its proximal location in the LAD, the decision was made to place an [place] stent. the stent we put was placed in position and deployed at 8 atmospheres for 45 seconds.A successful angioplasty and stent placement to a 90% proximal eccentric LAD lesi   Current Outpatient Prescriptions on File Prior to Visit  Medication Sig Dispense Refill  . Adalimumab (HUMIRA PEN) 40 MG/0.8ML PNKT Inject 40 mg into the skin daily.    [name] albuterol (PROVENTIL HFA;VENTOLIN HFA) 108 (90 BASE) MCG/ACT inhaler Inhale 2 puffs into the lungs every 6 (six) hours as needed for wheezing or shortness of breath. 1 Inhaler 5  . aspirin EC 81 MG tablet Take 81 mg by mouth daily.    . budesonide-formoterol (SYMBICORT) 160-4.5 MCG/ACT inhaler Inhale 2 puffs into the  lungs 2 (two) times daily. 1 Inhaler 3  . clobetasol ointment (TEMOVATE) AB-123456789 % Apply 1 application topically 2 (two) times daily.    . fluticasone (FLONASE) 50 MCG/ACT nasal spray Place 2 sprays into both nostrils daily as needed for allergies (congestion). 16 g 3  . Fluticasone-Salmeterol (ADVAIR DISKUS) 250-50 MCG/DOSE AEPB Inhale 1 puff into the lungs 2 (two) times daily. 3 each 3  . meloxicam (MOBIC) 15 MG tablet Take 15 mg by mouth daily.  1  . metoprolol succinate (TOPROL-XL) 25 MG 24 hr tablet Take 1 tablet (25 mg total) by mouth daily. <PLEASE MAKE APPOINTMENT FOR REFILLS> 30 tablet 0  . pantoprazole (PROTONIX) 40 MG tablet  Take 1 tablet (40 mg total) by mouth daily. 90 tablet 3  . simvastatin (ZOCOR) 20 MG tablet TAKE 1 TABLET EVERY MORNING(PATIENT NEEDS TO CONTACT  OFFICE FOR ADDITIONAL      REFILLS) 90 tablet 1  . Umeclidinium Bromide (INCRUSE ELLIPTA) 62.5 MCG/INH AEPB Inhale 1 Inhaler into the lungs daily. 1 each 5   No current facility-administered medications on file prior to visit.    No Known Allergies Social History   Social History  . Marital status: Married    Spouse name: N/A  . Number of children: N/A  . Years of education: N/A   Occupational History  . Not on file.   Social History Main Topics  . Smoking status: Former Research scientist (life sciences)  . Smokeless tobacco: Current User    Types: Chew  . Alcohol use 0.0 oz/week     Comment: occassionally  . Drug use: No  . Sexual activity: Not on file   Other Topics Concern  . Not on file   Social History Narrative   He is a married father of 2. He has 3 stepchildren, he has 10 grandchildren and 2 great   Grandchildren. He chews tobacco, does not   smoke. He seldom drinks alcohol.    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 He walks well over the golf course without any significant difficulties. Besides this he really does get routine exercise.       Review of Systems  All other systems reviewed and are negative.      Objective:   Physical Exam  Cardiovascular: Normal rate, regular rhythm and normal heart sounds.   Pulmonary/Chest: Effort normal and breath sounds normal.  Musculoskeletal:       Right wrist: He exhibits decreased range of motion, tenderness, bony tenderness and swelling.  Vitals reviewed.         Assessment & Plan:  Acute idiopathic gout, unspecified site - Plan: predniSONE (DELTASONE) 20 MG tablet, Uric acid, BASIC METABOLIC PANEL WITH GFR  Pain is out of proportion to exam. Therefore I believe this is gout.  I do not believe the patient has septic arthritis. I will check a uric acid level to confirm. Meanwhile start the patient on Depo-Medrol 80 mg IM 1 and then start prednisone taper pack beginning tomorrow

## 2016-07-20 MED ORDER — METHYLPREDNISOLONE ACETATE 80 MG/ML IJ SUSP
80.0000 mg | Freq: Once | INTRAMUSCULAR | Status: AC
  Administered 2016-07-17: 80 mg via INTRAMUSCULAR

## 2016-07-20 NOTE — Addendum Note (Signed)
Addended by: Shary Decamp B on: 07/20/2016 08:34 AM   Modules accepted: Orders

## 2016-08-20 DIAGNOSIS — E7800 Pure hypercholesterolemia, unspecified: Secondary | ICD-10-CM | POA: Diagnosis not present

## 2016-08-20 DIAGNOSIS — H33322 Round hole, left eye: Secondary | ICD-10-CM | POA: Diagnosis not present

## 2016-08-20 DIAGNOSIS — H43812 Vitreous degeneration, left eye: Secondary | ICD-10-CM | POA: Diagnosis not present

## 2016-08-20 DIAGNOSIS — H59812 Chorioretinal scars after surgery for detachment, left eye: Secondary | ICD-10-CM | POA: Diagnosis not present

## 2016-08-28 ENCOUNTER — Other Ambulatory Visit: Payer: Self-pay | Admitting: *Deleted

## 2016-08-28 MED ORDER — METOPROLOL SUCCINATE ER 25 MG PO TB24: 25.0000 mg | ORAL_TABLET | Freq: Every day | ORAL | 0 refills | Status: DC

## 2016-08-28 NOTE — Telephone Encounter (Signed)
metoprolol succinate (TOPROL-XL) 25 MG 24 hr tablet  Medication  Date: 04/22/2016 Department: [facility] Ordering/Authorizing: Leonie Man, MD  Order Providers   Prescribing Provider Encounter Provider  Leonie Man, MD Leonie Man, MD  Medication Detail    Disp Refills Start End   metoprolol succinate (TOPROL-XL) 25 MG 24 hr tablet 30 tablet 0 04/22/2016    Sig - Route: Take 1 tablet (25 mg total) by mouth daily. <PLEASE MAKE APPOINTMENT FOR REFILLS> - Oral   Notes to Pharmacy: Cannot authorize a 90 day supply as patient was due for an appt March 2017. Thanks   E-Prescribing Status: Receipt confirmed by pharmacy (04/22/2016 2:51 PM EDT)   Pharmacy   [facility] [place]   2nd attempt sent

## 2016-09-23 ENCOUNTER — Ambulatory Visit (INDEPENDENT_AMBULATORY_CARE_PROVIDER_SITE_OTHER): Payer: Medicare Other | Admitting: Cardiology

## 2016-09-23 ENCOUNTER — Encounter: Payer: Self-pay | Admitting: Cardiology

## 2016-09-23 VITALS — BP 118/64 | HR 61 | Ht 70.0 in | Wt 210.4 lb

## 2016-09-23 DIAGNOSIS — I25118 Atherosclerotic heart disease of native coronary artery with other forms of angina pectoris: Secondary | ICD-10-CM

## 2016-09-23 DIAGNOSIS — I10 Essential (primary) hypertension: Secondary | ICD-10-CM

## 2016-09-23 DIAGNOSIS — I2510 Atherosclerotic heart disease of native coronary artery without angina pectoris: Secondary | ICD-10-CM

## 2016-09-23 DIAGNOSIS — E7800 Pure hypercholesterolemia, unspecified: Secondary | ICD-10-CM | POA: Diagnosis not present

## 2016-09-23 DIAGNOSIS — Z9861 Coronary angioplasty status: Secondary | ICD-10-CM

## 2016-09-23 MED ORDER — ISOSORBIDE MONONITRATE ER 30 MG PO TB24: 30.0000 mg | ORAL_TABLET | Freq: Every day | ORAL | 3 refills | Status: DC

## 2016-09-23 MED ORDER — METOPROLOL SUCCINATE ER 25 MG PO TB24: 25.0000 mg | ORAL_TABLET | Freq: Every day | ORAL | 3 refills | Status: DC

## 2016-09-23 MED ORDER — SIMVASTATIN 20 MG PO TABS: ORAL_TABLET | ORAL | 3 refills | Status: DC

## 2016-09-23 MED ORDER — NITROGLYCERIN 0.4 MG SL SUBL: 0.4000 mg | SUBLINGUAL_TABLET | SUBLINGUAL | 5 refills | Status: DC | PRN

## 2016-09-23 NOTE — Patient Instructions (Addendum)
MEDICATION WERE REFILLED  START ISOSORBIDE MN 30 MG TAKE ONE TABLET 30 MIN AFTER DAILY DOSE ASPIRIN   LABS IN JAN 2018- CMP ,LIPID-  Your physician recommends that you schedule a follow-up appointment in: JAN 2018 Brad Owen.   If you need a refill on your cardiac medications before your next appointment, please call your pharmacy.

## 2016-09-23 NOTE — Progress Notes (Signed)
PCP: [name], MD  Clinic Note: Chief Complaint  Patient presents with  . Coronary Artery Disease    Annual follow-up  . Chest Pain    With extreme exertion    HPI: Brad Owen is a 70 y.o. male with a PMH below who presents today for annual follow-up of CAD-PCI with chronic risk factors. He is a former patient of Dr. Aldona Bar. I first saw in July 2014.[name] He is a history of CAD dating back to 1998 when he had coronary angioplasty with BMS placement. His last PCI was in 2003 with a DES to the OM. Repeat 2005 showed patent stents.  Brad Owen was last seen on 08/13/2015. He had some chest discomfort and we evaluated that with a Myoview stress test  Recent Hospitalizations: none  Studies Reviewed: Myoview: 08/2015  The left ventricular ejection fraction is normal (55-65%).  Nuclear stress EF: 58%.  The study is normal. This is a low risk study.  Interval History: Brad Owen presents today for routine follow-up really doing okay. He is notably last few months when he is doing a lot of strenuous work, he may notice a little bit of pressure in his chest. When he stops to rest it goes away. He'll noted is doing activities like baling hay, or walking a long distance. No routine activity. It is associated little bit of dyspnea, but not during routine activity.  No PND, orthopnea or edema.  He denies any sensation of palpitations, he just knows he has a chronic "skipped beat" l No ightheadedness, dizziness, weakness or syncope/near syncope. No TIA/amaurosis fugax symptoms.   e does have some calf and thigh discomfort with walking, but has a hard time establishing see this between his knee and hip pain. Not limiting.  ROS: A comprehensive was performed. Review of Systems  Constitutional: Negative for fever, malaise/fatigue and weight loss.  HENT: Negative for congestion and nosebleeds.   Respiratory: Negative for cough, shortness of breath and wheezing.   Cardiovascular:       Per history of present illness  Gastrointestinal: Negative for blood in stool, constipation, diarrhea, heartburn and melena.  Genitourinary: Negative for hematuria.  Musculoskeletal: Positive for joint pain (Knee and hip).  Skin: Negative.   Neurological: Negative for dizziness, loss of consciousness and headaches.  Endo/Heme/Allergies: Negative for environmental allergies.  Psychiatric/Behavioral: Negative.   All other systems reviewed and are negative.    Past Medical History:  Diagnosis Date  . Asthma   . CAD S/P percutaneous coronary angioplasty 1998   BMS to OM, followed by DES in 2003: PTCA a side branch.; Patent stents in 2005.  [name] COPD (chronic obstructive pulmonary disease) ([place])   . Erectile dysfunction   . GERD (gastroesophageal reflux disease)   . History of stress test 01/05/2004   abnormal stress test, positive bruce protocol exercise stress test with scintigraphic evidence of mild lateral wall ischemia at the apex. Dynamic gating reveals a preserved EF at 60% by QGS  . Hypercholesteremia   . Hypertension   . Psoriasis    Patchy psoriasis  . Psoriatic arthritis ([place])    hips    Past Surgical History:  Procedure Laterality Date  . CARDIAC CATHETERIZATION  03/1998   Normal left ventricular systolic function, Minor narrowing in the body of the stent, minor tapering in the distal RCA at the area of the PDA.  [name] CARDIAC CATHETERIZATION  12/06/2001   STENTS: At this time the 2.5 x 10 cutting balloon was placed  down the OM and a total of four inflations were performed, 10 for 58,12 for 62, 12 for 62, and 12x61. these for inflation at the bifurcation didi not impinge on the ostium of the branch, resulted in brisk distal flow and no evidence of any dissection or thrombus.  [name] CARDIAC CATHETERIZATION  05/10/2002   STENTS: the 2.5 x 66mm CrossSail balloon was placed in the ostium of the vessel and 2 inflation, 7 x 65 and 8 x 60 were performed, there was less than 10% area of  narrowing in the otium of this vassel and there was always TIMI-3 flow, there was also cutting ballon PTCA of the OM branch that was pinched by the stent.  [name] CARDIAC CATHETERIZATION  02.2005   Normal left ventricular systolic function, patent stents in both the circumflex and left anterior descending with only minimal in-stent restenosis in the left anterior descending.  [name] CARDIAC SURGERY    . CHOLECYSTECTOMY  06/05/2014  . CORONARY STENT PLACEMENT  06/06/1997   PTCA/Stent Placement-LAD A 3.0 x 15 mm [place] balloon. a 3.0 x 18 mm AVE (GFX) stent, a JL 4 guide catheter and a 300 cm extra-support wire. Because of its proximal location in the LAD, the decision was made to place an Stuart stent. the stent we put was placed in position and deployed at 8 atmospheres for 45 seconds.A successful angioplasty and stent placement to a 90% proximal eccentric LAD lesi  . NM MYOVIEW LTD  08/2015   Normal, low risk study. EF 55-60%.    Prior to Admission medications   Medication Sig Start Date End Date Taking? Authorizing Provider  Brad Owen (HUMIRA PEN) 40 MG/0.8ML PNKT Inject 40 mg into the skin daily.   Yes Historical Provider, MD  albuterol (PROVENTIL HFA;VENTOLIN HFA) 108 (90 BASE) MCG/ACT inhaler Inhale 2 puffs into the lungs every 6 (six) hours as needed for wheezing or shortness of breath. 06/18/15  Yes Alycia Rossetti, MD  aspirin EC 81 MG tablet Take 81 mg by mouth daily.   Yes Historical Provider, MD  budesonide-formoterol (SYMBICORT) 160-4.5 MCG/ACT inhaler Inhale 2 puffs into the lungs 2 (two) times daily. 11/11/15  Yes Susy Frizzle, MD  clobetasol ointment (TEMOVATE) [id number] % Apply 1 application topically 2 (two) times daily.   Yes Historical Provider, MD  fluticasone (FLONASE) 50 MCG/ACT nasal spray Place 2 sprays into both nostrils daily as needed for allergies (congestion). 06/18/15  Yes Alycia Rossetti, MD  Fluticasone-Salmeterol (ADVAIR DISKUS) 250-50 MCG/DOSE AEPB Inhale 1 puff into the  lungs 2 (two) times daily. 06/18/15  Yes Alycia Rossetti, MD  meloxicam (MOBIC) 15 MG tablet Take 15 mg by mouth daily. 10/10/15  Yes Historical Provider, MD  metoprolol succinate (TOPROL-XL) 25 MG 24 hr tablet Take 1 tablet (25 mg total) by mouth daily. <PLEASE MAKE APPOINTMENT FOR REFILLS> 08/28/16  Yes Leonie Man, MD  pantoprazole (PROTONIX) 40 MG tablet Take 1 tablet (40 mg total) by mouth daily. 04/22/16  Yes Susy Frizzle, MD  predniSONE (DELTASONE) 20 MG tablet 3 tabs poqday 1-2, 2 tabs poqday 3-4, 1 tab poqday 5-6 07/17/16  Yes Susy Frizzle, MD  simvastatin (ZOCOR) 20 MG tablet TAKE 1 TABLET EVERY MORNING(PATIENT NEEDS TO CONTACT  OFFICE FOR ADDITIONAL      REFILLS) 07/13/16  Yes Leonie Man, MD   No Known Allergies   Social History   Social History  . Marital status: Married    Spouse name: N/A  .  Number of children: N/A  . Years of education: N/A   Social History Main Topics  . Smoking status: Former Research scientist (life sciences)  . Smokeless tobacco: Current User    Types: Chew  . Alcohol use 0.0 oz/week     Comment: occassionally  . Drug use: No  . Sexual activity: Not Asked   Other Topics Concern  . None   Social History Narrative   He is a married father of 2. He has 3 stepchildren, he has 10 grandchildren and 2 great   Grandchildren. He chews tobacco, does not   smoke. He seldom drinks alcohol.    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 He walks well over the golf course without any significant difficulties. Besides this he really does get routine exercise.     Family History  Problem Relation Age of Onset  . Heart Problems Mother   . Diabetes Father   . Heart Problems Father   . Diabetes Sister      Wt Readings from Last 3 Encounters:  09/23/16 95.4 kg (210 lb 6.4 oz)  07/17/16 94.8 kg (209 lb)  11/11/15 98 kg (216 lb)    PHYSICAL EXAM BP  118/64   Pulse 61   Ht 5\' 10"  (1.778 m)   Wt 95.4 kg (210 lb 6.4 oz)   BMI 30.19 kg/m  General: He is a pleasant, healthy appearing gentleman in no acute distress, alert and oriented x3, answers questions appropriately. He is well-groomed and well-nourished. He has a well-groomed beard. Normal mood and affect.  HEENT: NCAT, EOMI, MMM. Anicteric sclerae.  Neck: Supple, no LAN, JVD or carotid bruit.  Heart: RRR, normal S1, S2, occasional ectopy. No M/R/G. Nondisplaced PMI.  Lungs: CTAB, nonlabored, normal effort, good air movement.  Abdomen: Soft/NT/ND/NABS. No HSM.  Extremities: No C/C/E, patches of psoriasis on both of his thighs and knees. strength, normal gait Neuro: grossly intact.   Adult ECG Report  Rate: 61 ;  Rhythm: normal sinus rhythm and normal axis, intervals & durations;   Narrative Interpretation: normal EKG   Other studies Reviewed: Additional studies/ records that were reviewed today include:  Recent Labs: none this year Lab Results  Component Value Date   CHOL 142 08/20/2015   HDL 51 08/20/2015   LDLCALC 81 08/20/2015   TRIG 51 08/20/2015   CHOLHDL 2.8 08/20/2015    ASSESSMENT / PLAN: Problem List Items Addressed This Visit    Hypercholesteremia (Chronic)    On statin.   masters labs look good. He is due for follow-up. -- We'll order chemistry panel and the panel.      Relevant Medications   metoprolol succinate (TOPROL-XL) 25 MG 24 hr tablet   simvastatin (ZOCOR) 20 MG tablet   isosorbide mononitrate (IMDUR) 30 MG 24 hr tablet   nitroGLYCERIN (NITROSTAT) 0.4 MG SL tablet   Other Relevant Orders   Lipid panel   Comprehensive metabolic panel   Essential hypertension (Chronic)    Well-controlled on low-dose beta blocker. Unable to titrate further because of orthostatic dizziness. Also has a result, no potential to add ARB or ACE inhibitor.      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 last year his symptoms are little less concerning and had a negative stress test. We talked about potential options, he is not overly concerned, because it is not limiting him all that much. Plan: Start Imdur and monitor.  We will see him back in January to reassess symptoms, if it gets progressively worse or no improvement with Imdur, and we may need to consider invasive evaluation cardiac catheterization.  Continue statin and beta blocker. Refill when necessary nitroglycerin. Continue aspirin.      Relevant Medications   metoprolol succinate (TOPROL-XL) 25 MG 24 hr tablet   simvastatin (ZOCOR) 20 MG tablet   isosorbide mononitrate (IMDUR) 30 MG 24 hr tablet   nitroGLYCERIN (NITROSTAT) 0.4 MG SL tablet   CAD S/P percutaneous coronary angioplasty (Chronic)    Distant PCI back in 2003 and 2005 with BMS stents. Essentially normal Myoview last year September. On aspirin, statin and beta blocker. With some recurrence of chronic stable symptoms, reassessing symptomatology and low threshold for relook catheterization.      Relevant Medications   metoprolol succinate (TOPROL-XL) 25 MG 24 hr tablet   simvastatin (ZOCOR) 20 MG tablet   isosorbide mononitrate (IMDUR) 30 MG 24 hr tablet   nitroGLYCERIN (NITROSTAT) 0.4 MG SL tablet   Other Relevant Orders   Lipid panel   Comprehensive metabolic panel    Other Visit Diagnoses   None.     Current medicines are reviewed at length with the patient today. (+/- concerns) None The following changes have been made:  See below  Patient Instructions  MEDICATION  WERE REFILLED  START ISOSORBIDE MN 30 MG TAKE ONE TABLET 30 MIN AFTER DAILY DOSE ASPIRIN   LABS IN JAN 2018- CMP ,LIPID-  Your physician recommends that you schedule a follow-up appointment in: JAN 2018 [facility] [place].   If you need a refill on your cardiac medications before your next appointment, please call your pharmacy.   Studies Ordered:   Orders Placed This Encounter  Procedures  . Lipid panel  . Comprehensive metabolic panel      Glenetta Hew, M.D., M.S. Interventional Cardiologist   Pager # [phone] Phone # [phone] [address] [place], [place] [address]

## 2016-09-24 DIAGNOSIS — E7800 Pure hypercholesterolemia, unspecified: Secondary | ICD-10-CM | POA: Diagnosis not present

## 2016-09-24 DIAGNOSIS — H59812 Chorioretinal scars after surgery for detachment, left eye: Secondary | ICD-10-CM | POA: Diagnosis not present

## 2016-09-24 DIAGNOSIS — H43812 Vitreous degeneration, left eye: Secondary | ICD-10-CM | POA: Diagnosis not present

## 2016-09-24 DIAGNOSIS — H33322 Round hole, left eye: Secondary | ICD-10-CM | POA: Diagnosis not present

## 2016-09-25 ENCOUNTER — Encounter: Payer: Self-pay | Admitting: Cardiology

## 2016-09-25 NOTE — Assessment & Plan Note (Signed)
Well-controlled on low-dose beta blocker. Unable to titrate further because of orthostatic dizziness. Also has a result, no potential to add ARB or ACE inhibitor.

## 2016-09-25 NOTE — Assessment & Plan Note (Signed)
Distant PCI back in 2003 and 2005 with BMS stents. Essentially normal Myoview last year September. On aspirin, statin and beta blocker. With some recurrence of chronic stable symptoms, reassessing symptomatology and low threshold for relook catheterization.

## 2016-09-25 NOTE — Assessment & Plan Note (Signed)
His discomfort with exertion and working hard on his low but concerning in new  as compared to last year. last year his symptoms are little less concerning and had a negative stress test. We talked about potential options, he is not overly concerned, because it is not limiting him all that much. Plan: Start Imdur and monitor.  We will see him back in January to reassess symptoms, if it gets progressively worse or no improvement with Imdur, and we may need to consider invasive evaluation cardiac catheterization.  Continue statin and beta blocker. Refill when necessary nitroglycerin. Continue aspirin.

## 2016-09-25 NOTE — Assessment & Plan Note (Addendum)
On statin.   masters labs look good. He is due for follow-up. -- We'll order chemistry panel and the panel.

## 2016-10-02 ENCOUNTER — Telehealth: Payer: Self-pay | Admitting: Cardiology

## 2016-10-02 NOTE — Telephone Encounter (Signed)
Spoke to patient. States headache develops after taking isosorbide in the mornings.  suggest to take aspirin and isosorbide mono towards bedtime for few days .- if headache continues take 1/2 table of isosorbide  After aspirin at bedtime. headache continues contact office back- will inform Dr Ellyn Hack

## 2016-10-02 NOTE — Telephone Encounter (Signed)
Mr. Ruppe is calling because Dr. Ellyn Hack told him to take a pill 30 minutes later after he takes his aspirin and it is giving him very bad headaches and is wanting to know what can he for it . Please call   thanks

## 2016-10-02 NOTE — Addendum Note (Signed)
Addended by: Leland Johns A on: 10/02/2016 02:56 PM   Modules accepted: Orders

## 2016-10-05 NOTE — Telephone Encounter (Signed)
Agree with this recommendation  [facility]

## 2016-10-28 ENCOUNTER — Telehealth: Payer: Self-pay | Admitting: Cardiology

## 2016-10-28 ENCOUNTER — Encounter: Payer: Self-pay | Admitting: Cardiology

## 2016-10-28 NOTE — Telephone Encounter (Signed)
New message  Pt is calling and wants to get his cholesterol checked   pt has already had blood drawn today, provider states they have enough to do cholesterol test/need call from doctor  Wants doctor to put in an order  Wants blood work done at [facility]  Please call back and advise

## 2016-10-28 NOTE — Telephone Encounter (Signed)
Spoke to patient. He notes he was not fasting for today's test. I explained this would need to be a fasting test.  Also reviewed notes, labs were ordered w recommendation to check in 3 months (Jan 2018, same month as his f/u visit). I gave him instructions on pre OV fasting labs and to go to [place] to have these drawn. Pt agreeable to this and voiced understanding and thanks.

## 2016-10-29 DIAGNOSIS — Z85820 Personal history of malignant melanoma of skin: Secondary | ICD-10-CM | POA: Diagnosis not present

## 2016-10-29 DIAGNOSIS — D235 Other benign neoplasm of skin of trunk: Secondary | ICD-10-CM | POA: Diagnosis not present

## 2016-10-29 DIAGNOSIS — L570 Actinic keratosis: Secondary | ICD-10-CM | POA: Diagnosis not present

## 2016-10-29 DIAGNOSIS — D1801 Hemangioma of skin and subcutaneous tissue: Secondary | ICD-10-CM | POA: Diagnosis not present

## 2016-10-29 DIAGNOSIS — L821 Other seborrheic keratosis: Secondary | ICD-10-CM | POA: Diagnosis not present

## 2016-10-29 DIAGNOSIS — L814 Other melanin hyperpigmentation: Secondary | ICD-10-CM | POA: Diagnosis not present

## 2016-11-05 ENCOUNTER — Other Ambulatory Visit: Payer: Self-pay | Admitting: Family Medicine

## 2016-11-17 NOTE — Progress Notes (Signed)
Recent Labs: 11/10/2016 Na+ 137, K+ 3.9, Cl- 103, HCO3- 26.5, BUN 17, Cr 1.18 (GFR 61), Glu 128 (elevated), Ca2+ 8.6; AST 29, ALT 35, AlkP 61, Alb 3.7, TP 7.1, T Bili 1.0 CBC: W 9.3, H/H 14.1/41.3, Plt 174 N/A - No Lipids  Overall pretty good/normal levels. Kidney function is borderline, but still within relatively normal range. Normal LFTs. Normal electrolytes  Glenetta Hew, MD

## 2016-12-11 ENCOUNTER — Ambulatory Visit (INDEPENDENT_AMBULATORY_CARE_PROVIDER_SITE_OTHER): Payer: Medicare Other | Admitting: Family Medicine

## 2016-12-11 ENCOUNTER — Encounter: Payer: Self-pay | Admitting: Family Medicine

## 2016-12-11 VITALS — BP 120/72 | HR 71 | Temp 98.9°F | Resp 16 | Ht 70.0 in | Wt 212.0 lb

## 2016-12-11 DIAGNOSIS — Z125 Encounter for screening for malignant neoplasm of prostate: Secondary | ICD-10-CM | POA: Diagnosis not present

## 2016-12-11 DIAGNOSIS — Z23 Encounter for immunization: Secondary | ICD-10-CM

## 2016-12-11 DIAGNOSIS — E781 Pure hyperglyceridemia: Secondary | ICD-10-CM

## 2016-12-11 DIAGNOSIS — Z1159 Encounter for screening for other viral diseases: Secondary | ICD-10-CM | POA: Diagnosis not present

## 2016-12-11 DIAGNOSIS — E7800 Pure hypercholesterolemia, unspecified: Secondary | ICD-10-CM

## 2016-12-11 DIAGNOSIS — Z0000 Encounter for general adult medical examination without abnormal findings: Secondary | ICD-10-CM

## 2016-12-11 LAB — COMPLETE METABOLIC PANEL WITH GFR
ALT: 33 U/L (ref 9–46)
AST: 24 U/L (ref 10–35)
Albumin: 3.7 g/dL (ref 3.6–5.1)
Alkaline Phosphatase: 71 U/L (ref 40–115)
BILIRUBIN TOTAL: 0.4 mg/dL (ref 0.2–1.2)
BUN: 15 mg/dL (ref 7–25)
CO2: 27 mmol/L (ref 20–31)
CREATININE: 1.14 mg/dL (ref 0.70–1.25)
Calcium: 9.1 mg/dL (ref 8.6–10.3)
Chloride: 103 mmol/L (ref 98–110)
GFR, Est African American: 75 mL/min (ref >=60)
GFR, Est Non African American: 65 mL/min (ref >=60)
Glucose, Bld: 109 mg/dL — ABNORMAL HIGH (ref 70–99)
Potassium: 4 mmol/L (ref 3.5–5.3)
Sodium: 139 mmol/L (ref 135–146)
TOTAL PROTEIN: 7.3 g/dL (ref 6.1–8.1)

## 2016-12-11 LAB — CBC WITH DIFFERENTIAL/PLATELET
BASOS PCT: 1 %
Basophils Absolute: 86 cells/uL (ref 0–200)
EOS ABS: 860 {cells}/uL — AB (ref 15–500)
Eosinophils Relative: 10 %
HEMATOCRIT: 39.6 % (ref 38.5–50.0)
Hemoglobin: 12.9 g/dL — ABNORMAL LOW (ref 13.0–17.0)
Lymphocytes Relative: 39 %
Lymphs Abs: 3354 cells/uL (ref 850–3900)
MCH: 29.7 pg (ref 27.0–33.0)
MCHC: 32.6 g/dL (ref 32.0–36.0)
MCV: 91.2 fL (ref 80.0–100.0)
MONO ABS: 1032 {cells}/uL — AB (ref 200–950)
MONOS PCT: 12 %
MPV: 9.4 fL (ref 7.5–12.5)
NEUTROS ABS: 3268 {cells}/uL (ref 1500–7800)
Neutrophils Relative %: 38 %
PLATELETS: 186 10*3/uL (ref 140–400)
RBC: 4.34 MIL/uL (ref 4.20–5.80)
RDW: 12.9 % (ref 11.0–15.0)
WBC: 8.6 10*3/uL (ref 3.8–10.8)

## 2016-12-11 LAB — LIPID PANEL
Cholesterol: 125 mg/dL (ref <200)
HDL: 44 mg/dL (ref >40)
LDL CALC: 73 mg/dL (ref <100)
TRIGLYCERIDES: 41 mg/dL (ref <150)
Total CHOL/HDL Ratio: 2.8 Ratio (ref <5.0)
VLDL: 8 mg/dL (ref <30)

## 2016-12-11 LAB — PSA: PSA: 1.1 ng/mL (ref <=4.0)

## 2016-12-11 NOTE — Progress Notes (Signed)
Subjective:    Patient ID: Brad Owen, male    DOB: [birth year], 71 y.o.   MRN: [id number]  HPI  Since I last saw the patient, his cardiologist has started him on long-acting nitroglycerin, imdur, for mild angina. This is really help the patient. He states that he is no longer getting the pressure in his chest with activity. Overall he is doing well. His immunizations are up-to-date. His chart states that he is due for Pneumovax 23 but he had originally when he was almost 87 and therefore I believe insufficient. Prevnar 13 is up-to-date. He will receive his flu shot today. His colonoscopy is not due again until 2021. I will perform his prostate exam today. We discussed hepatitis C screening and he agrees to this today area otherwise he is been doing well with no concerns Past Medical History:  Diagnosis Date  . Asthma   . CAD S/P percutaneous coronary angioplasty 1998   BMS to OM, followed by DES in 2003: PTCA a side branch.; Patent stents in 2005.  [name] COPD (chronic obstructive pulmonary disease) ([place])   . Erectile dysfunction   . GERD (gastroesophageal reflux disease)   . History of stress test 01/05/2004   abnormal stress test, positive bruce protocol exercise stress test with scintigraphic evidence of mild lateral wall ischemia at the apex. Dynamic gating reveals a preserved EF at 60% by QGS  . Hypercholesteremia   . Hypertension   . Psoriasis    Patchy psoriasis  . Psoriatic arthritis ([place])    hips   Past Surgical History:  Procedure Laterality Date  . CARDIAC CATHETERIZATION  03/1998   Normal left ventricular systolic function, Minor narrowing in the body of the stent, minor tapering in the distal RCA at the area of the PDA.  [name] CARDIAC CATHETERIZATION  12/06/2001   STENTS: At this time the 2.5 x 10 cutting balloon was placed down the OM and a total of four inflations were performed, 10 for 58,12 for 62, 12 for 62, and 12x61. these for inflation at the bifurcation didi not impinge  on the ostium of the branch, resulted in brisk distal flow and no evidence of any dissection or thrombus.  [name] CARDIAC CATHETERIZATION  05/10/2002   STENTS: the 2.5 x 19mm CrossSail balloon was placed in the ostium of the vessel and 2 inflation, 7 x 65 and 8 x 60 were performed, there was less than 10% area of narrowing in the otium of this vassel and there was always TIMI-3 flow, there was also cutting ballon PTCA of the OM branch that was pinched by the stent.  [name] CARDIAC CATHETERIZATION  02.2005   Normal left ventricular systolic function, patent stents in both the circumflex and left anterior descending with only minimal in-stent restenosis in the left anterior descending.  [name] CARDIAC SURGERY    . CHOLECYSTECTOMY  06/05/2014  . CORONARY STENT PLACEMENT  06/06/1997   PTCA/Stent Placement-LAD A 3.0 x 15 mm [place] Ranger balloon. a 3.0 x 18 mm AVE (GFX) stent, a JL 4 guide catheter and a 300 cm extra-support wire. Because of its proximal location in the LAD, the decision was made to place an [place] stent. the stent we put was placed in position and deployed at 8 atmospheres for 45 seconds.A successful angioplasty and stent placement to a 90% proximal eccentric LAD lesi  . NM MYOVIEW LTD  08/2015   Normal, low risk study. EF 55-60%.   Current Outpatient Prescriptions on File Prior to Visit  Medication Sig Dispense Refill  . Adalimumab (HUMIRA PEN) 40 MG/0.8ML PNKT Inject 40 mg into the skin daily.    [name] albuterol (PROVENTIL HFA;VENTOLIN HFA) 108 (90 BASE) MCG/ACT inhaler Inhale 2 puffs into the lungs every 6 (six) hours as needed for wheezing or shortness of breath. 1 Inhaler 5  . aspirin EC 81 MG tablet Take 81 mg by mouth daily.    . budesonide-formoterol (SYMBICORT) 160-4.5 MCG/ACT inhaler Inhale 2 puffs into the lungs 2 (two) times daily. 1 Inhaler 3  . clobetasol ointment (TEMOVATE) [id number] % Apply 1 application topically 2 (two) times daily.    . fluticasone (FLONASE) 50 MCG/ACT nasal spray PLACE 2 SPRAYS  INTO BOTH NOSTRILS DAILY AS NEEDED FOR ALLERGIES (CONGESTION). 16 g 1  . Fluticasone-Salmeterol (ADVAIR DISKUS) 250-50 MCG/DOSE AEPB Inhale 1 puff into the lungs 2 (two) times daily. 3 each 3  . isosorbide mononitrate (IMDUR) 30 MG 24 hr tablet Take 1 tablet (30 mg total) by mouth daily. TAKE 30 MINUTES AFTER DAILY ASPIRIN DOSE 90 tablet 3  . meloxicam (MOBIC) 15 MG tablet Take 15 mg by mouth daily.  1  . metoprolol succinate (TOPROL-XL) 25 MG 24 hr tablet Take 1 tablet (25 mg total) by mouth daily. 90 tablet 3  . nitroGLYCERIN (NITROSTAT) 0.4 MG SL tablet Place 1 tablet (0.4 mg total) under the tongue every 5 (five) minutes as needed for chest pain. 25 tablet 5  . pantoprazole (PROTONIX) 40 MG tablet Take 1 tablet (40 mg total) by mouth daily. 90 tablet 3  . simvastatin (ZOCOR) 20 MG tablet TAKE 1 TABLET EVERY MORNING 90 tablet 3   No current facility-administered medications on file prior to visit.    No Known Allergies Social History   Social History  . Marital status: Married    Spouse name: N/A  . Number of children: N/A  . Years of education: N/A   Occupational History  . Not on file.   Social History Main Topics  . Smoking status: Former Research scientist (life sciences)  . Smokeless tobacco: Current User    Types: Chew  . Alcohol use 0.0 oz/week     Comment: occassionally  . Drug use: No  . Sexual activity: Not on file   Other Topics Concern  . Not on file   Social History Narrative   He is a married father of 2. He has 3 stepchildren, he has 10 grandchildren and 2 great   Grandchildren. He chews tobacco, does not   smoke. He seldom drinks alcohol.    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 He walks well over the golf course without any significant difficulties. Besides this he really does get routine exercise.    Family History  Problem Relation Age of Onset  . Heart Problems  Mother   . Diabetes Father   . Heart Problems Father   . Diabetes Sister      Review of Systems  All other systems reviewed and are negative.      Objective:   Physical Exam  Constitutional: He is oriented to person, place, and time. He appears well-developed and well-nourished. No distress.  HENT:  Head: Normocephalic and atraumatic.  Right Ear: External ear normal.  Left Ear: External ear normal.  Nose: Nose normal.  Mouth/Throat: Oropharynx is clear and moist. No oropharyngeal exudate.  Eyes: Conjunctivae and EOM are normal. Pupils  are equal, round, and reactive to light. Right eye exhibits no discharge. Left eye exhibits no discharge. No scleral icterus.  Neck: Normal range of motion. Neck supple. No JVD present. No tracheal deviation present. No thyromegaly present.  Cardiovascular: Normal rate, regular rhythm, normal heart sounds and intact distal pulses.  Exam reveals no gallop and no friction rub.   No murmur heard. Pulmonary/Chest: Effort normal and breath sounds normal. No stridor. No respiratory distress. He has no wheezes. He has no rales. He exhibits no tenderness.  Abdominal: Soft. Bowel sounds are normal. He exhibits no distension and no mass. There is no tenderness. There is no rebound and no guarding.  Genitourinary: Rectum normal and prostate normal.  Musculoskeletal: Normal range of motion. He exhibits no edema, tenderness or deformity.  Lymphadenopathy:    He has no cervical adenopathy.  Neurological: He is alert and oriented to person, place, and time. He displays normal reflexes. No cranial nerve deficit. He exhibits normal muscle tone. Coordination normal.  Skin: Skin is warm. No rash noted. He is not diaphoretic. No erythema. No pallor.  Psychiatric: He has a normal mood and affect. His behavior is normal. Judgment and thought content normal.  Vitals reviewed.         Assessment & Plan:  Prostate cancer screening - Plan: PSA  Pure  hyperglyceridemia  Pure hypercholesterolemia - Plan: CBC with Differential/Platelet, COMPLETE METABOLIC PANEL WITH GFR, Lipid panel  Need for hepatitis C screening test - Plan: Hepatitis C Ab Reflex HCV RNA, QUANT  Routine general medical examination at a health care facility  Physical exam today is completely normal. Prostate exam is normal but I will screen the patient for prostate cancer with PSA. I will check a fasting lipid panel. Given his history of coronary artery disease, his goal LDL cholesterol is less than 70. His blood pressure today is well under control. I will screen the patient for hepatitis C. He will receive his flu shot. Colonoscopy is up-to-date. Other immunizations are up-to-date except for the shingles vaccine which we have discussed in the past.

## 2016-12-11 NOTE — Addendum Note (Signed)
Addended by: Shary Decamp B on: 12/11/2016 10:27 AM   Modules accepted: Orders

## 2016-12-12 LAB — HEPATITIS C ANTIBODY: HCV AB: NEGATIVE

## 2016-12-25 ENCOUNTER — Encounter: Payer: Self-pay | Admitting: Cardiology

## 2016-12-25 ENCOUNTER — Ambulatory Visit (INDEPENDENT_AMBULATORY_CARE_PROVIDER_SITE_OTHER): Payer: Medicare Other | Admitting: Cardiology

## 2016-12-25 DIAGNOSIS — I10 Essential (primary) hypertension: Secondary | ICD-10-CM

## 2016-12-25 DIAGNOSIS — R0609 Other forms of dyspnea: Secondary | ICD-10-CM | POA: Diagnosis not present

## 2016-12-25 DIAGNOSIS — I25118 Atherosclerotic heart disease of native coronary artery with other forms of angina pectoris: Secondary | ICD-10-CM | POA: Diagnosis not present

## 2016-12-25 DIAGNOSIS — R0600 Dyspnea, unspecified: Secondary | ICD-10-CM

## 2016-12-25 DIAGNOSIS — E785 Hyperlipidemia, unspecified: Secondary | ICD-10-CM

## 2016-12-25 MED ORDER — ISOSORBIDE MONONITRATE ER 30 MG PO TB24: 30.0000 mg | ORAL_TABLET | Freq: Every day | ORAL | 3 refills | Status: DC

## 2016-12-25 MED ORDER — METOPROLOL SUCCINATE ER 25 MG PO TB24: 25.0000 mg | ORAL_TABLET | Freq: Every day | ORAL | 3 refills | Status: DC

## 2016-12-25 NOTE — Patient Instructions (Addendum)
No change with current medications PRESCRIPTION  REFILL SENT TO MAIL ORDER   Your physician wants you to follow-up in 12 months with Dr Ellyn Hack. You will receive a reminder letter in the mail two months in advance. If you don't receive a letter, please call our office to schedule the follow-up appointment.    If you need a refill on your cardiac medications before your next appointment, please call your pharmacy.

## 2016-12-25 NOTE — Progress Notes (Signed)
PCP: [name], MD  Clinic Note: Chief Complaint  Patient presents with  . Follow-up    Follow-up for intermittent chest pain in October 2017. History of CAD.  . Edema    in ankles.    HPI: Brad Owen is a 71 y.o. male with a PMH below who presents today for Three-month follow-up of CAD-PCI with chronic risk factors. He is a former patient of Dr. Aldona Bar. I first saw in July 2014.Brad Owen He is a history of CAD dating back to 1998 when he had coronary angioplasty with BMS placement. His last PCI was in 2003 with a DES to the OM. Repeat 2005 showed patent stents. Last Myoview was in the fall of 2016 that was low risk with no ischemia.  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 He did not seem to be overly concerned, because these symptoms were not very limiting, and were similar to 2016 when he had a negative Myoview. The plan was to see him back in short follow-up to reassess.  Recent Hospitalizations: none  Studies Reviewed: None  Interval History: Brad Owen presents today indicating that he really isn't having much in the way of that chest discomfort is. It was probably more related to the work he was doing, because with working out at [employer] doing cardio exercises and light weights, he's not having that symptom. Unfortunately, he really can't do a lot of exercise such as treadmill or bicycle because of his hips. So his walking exercise is limited. He does more upper body Exercises.   He really doesn't have much the way of any exertional dyspnea unless he really pushes it. No obvious concerning symptoms of chest tightness with rest or exertion at this point. No PND, orthopnea- but mild and the day edema. He also notes his feet get cold when the swelling occurs.  He denies any sensation of palpitations, he just knows he has a chronic "skipped beat"  No ightheadedness, dizziness, weakness or  syncope/near syncope. No TIA/amaurosis fugax symptoms.   He does have some calf and thigh discomfort with walking, but has a hard time establishing see this between his knee and hip pain. Not limiting.  ROS: A comprehensive was performed. Review of Systems  Constitutional: Negative for fever, malaise/fatigue and weight loss.  HENT: Negative for congestion and nosebleeds.   Respiratory: Negative for cough, shortness of breath and wheezing.   Cardiovascular:       Per history of present illness  Gastrointestinal: Negative for blood in stool, constipation, diarrhea, heartburn and melena.  Genitourinary: Negative for hematuria.  Musculoskeletal: Positive for joint pain (Knee and hip).  Skin: Negative.   Neurological: Negative for dizziness, loss of consciousness and headaches.  Endo/Heme/Allergies: Negative for environmental allergies.  Psychiatric/Behavioral: The patient has insomnia (Has a hard time going to sleep. He stays up quite a bit).   All other systems reviewed and are negative. - he thinks his arthritis pains are related to psoriatic arthritis   Past Medical History:  Diagnosis Date  . Asthma   . CAD S/P percutaneous coronary angioplasty 1998   BMS to OM, followed by DES in 2003: PTCA a side branch.; Patent stents in 2005.  Brad Owen COPD (chronic obstructive pulmonary disease) ([place])   . Erectile dysfunction   . GERD (gastroesophageal reflux disease)   . History of stress test 01/05/2004   abnormal stress test, positive bruce protocol exercise  stress test with scintigraphic evidence of mild lateral wall ischemia at the apex. Dynamic gating reveals a preserved EF at 60% by QGS  . Hypercholesteremia   . Hypertension   . Psoriasis    Patchy psoriasis  . Psoriatic arthritis ([place])    hips    Past Surgical History:  Procedure Laterality Date  . CARDIAC CATHETERIZATION  03/1998   Normal left ventricular systolic function, Minor narrowing in the body of the stent, minor tapering in  the distal RCA at the area of the PDA.  Brad Owen CARDIAC CATHETERIZATION  12/06/2001   STENTS: At this time the 2.5 x 10 cutting balloon was placed down the OM and a total of four inflations were performed, 10 for 58,12 for 62, 12 for 62, and 12x61. these for inflation at the bifurcation didi not impinge on the ostium of the branch, resulted in brisk distal flow and no evidence of any dissection or thrombus.  Brad Owen CARDIAC CATHETERIZATION  05/10/2002   STENTS: the 2.5 x 71mm CrossSail balloon was placed in the ostium of the vessel and 2 inflation, 7 x 65 and 8 x 60 were performed, there was less than 10% area of narrowing in the otium of this vassel and there was always TIMI-3 flow, there was also cutting ballon PTCA of the OM branch that was pinched by the stent.  Brad Owen CARDIAC CATHETERIZATION  02.2005   Normal left ventricular systolic function, patent stents in both the circumflex and left anterior descending with only minimal in-stent restenosis in the left anterior descending.  Brad Owen CARDIAC SURGERY    . CHOLECYSTECTOMY  06/05/2014  . CORONARY STENT PLACEMENT  06/06/1997   PTCA/Stent Placement-LAD A 3.0 x 15 mm [place] Ranger balloon. a 3.0 x 18 mm AVE (GFX) stent, a JL 4 guide catheter and a 300 cm extra-support wire. Because of its proximal location in the LAD, the decision was made to place an [place] stent. the stent we put was placed in position and deployed at 8 atmospheres for 45 seconds.A successful angioplasty and stent placement to a 90% proximal eccentric LAD lesi  . NM MYOVIEW LTD  08/2015   Normal, low risk study. EF 55-60%.   Current Meds  Medication Sig  . Adalimumab (HUMIRA PEN) 40 MG/0.8ML PNKT Inject 40 mg into the skin daily.  Brad Owen albuterol (PROVENTIL HFA;VENTOLIN HFA) 108 (90 BASE) MCG/ACT inhaler Inhale 2 puffs into the lungs every 6 (six) hours as needed for wheezing or shortness of breath.  Brad Owen aspirin EC 81 MG tablet Take 81 mg by mouth daily.  . budesonide-formoterol (SYMBICORT) 160-4.5 MCG/ACT  inhaler Inhale 2 puffs into the lungs 2 (two) times daily.  . fluticasone (FLONASE) 50 MCG/ACT nasal spray PLACE 2 SPRAYS INTO BOTH NOSTRILS DAILY AS NEEDED FOR ALLERGIES (CONGESTION).  Brad Owen isosorbide mononitrate (IMDUR) 30 MG 24 hr tablet Take 1 tablet (30 mg total) by mouth daily. TAKE 30 MINUTES AFTER DAILY ASPIRIN DOSE  . meloxicam (MOBIC) 15 MG tablet Take 15 mg by mouth daily.  . metoprolol succinate (TOPROL-XL) 25 MG 24 hr tablet Take 1 tablet (25 mg total) by mouth daily.  . pantoprazole (PROTONIX) 40 MG tablet Take 1 tablet (40 mg total) by mouth daily.  . simvastatin (ZOCOR) 20 MG tablet TAKE 1 TABLET EVERY MORNING  . [DISCONTINUED] isosorbide mononitrate (IMDUR) 30 MG 24 hr tablet Take 1 tablet (30 mg total) by mouth daily. TAKE 30 MINUTES AFTER DAILY ASPIRIN DOSE  . [DISCONTINUED] isosorbide mononitrate (IMDUR) 30 MG 24 hr tablet  Take 1 tablet (30 mg total) by mouth daily. TAKE 30 MINUTES AFTER DAILY ASPIRIN DOSE  . [DISCONTINUED] metoprolol succinate (TOPROL-XL) 25 MG 24 hr tablet Take 1 tablet (25 mg total) by mouth daily.  . [DISCONTINUED] metoprolol succinate (TOPROL-XL) 25 MG 24 hr tablet Take 1 tablet (25 mg total) by mouth daily.    No Known Allergies   Social History   Social History  . Marital status: Married    Spouse name: N/A  . Number of children: N/A  . Years of education: N/A   Social History Main Topics  . Smoking status: Former Research scientist (life sciences)  . Smokeless tobacco: Current User    Types: Chew  . Alcohol use 0.0 oz/week     Comment: occassionally  . Drug use: No  . Sexual activity: Not Asked   Other Topics Concern  . None   Social History Narrative   He is a married father of 2. He has 3 stepchildren, he has 10 grandchildren and 2 great   Grandchildren. He chews tobacco, does not   smoke. He seldom drinks alcohol.    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 He walks well over the golf course without any significant difficulties. Besides this he really does get routine exercise.     Family History  Problem Relation Age of Onset  . Heart Problems Mother   . Diabetes Father   . Heart Problems Father   . Diabetes Sister      Wt Readings from Last 3 Encounters:  12/25/16 95.8 kg (211 lb 3.2 oz)  12/11/16 96.2 kg (212 lb)  09/23/16 95.4 kg (210 lb 6.4 oz)    PHYSICAL EXAM BP 125/71   Pulse 73   Ht 5\' 10"  (1.778 m)   Wt 95.8 kg (211 lb 3.2 oz)   BMI 30.30 kg/m  General: He is a pleasant, healthy appearing gentleman in no acute distress, alert and oriented x3, answers questions appropriately. He is well-groomed and well-nourished. He has a well-groomed beard. Normal mood and affect.  HEENT: NCAT, EOMI, MMM. Anicteric sclerae.  Neck: Supple, no LAN, JVD or carotid bruit.  Heart: RRR, normal S1, S2, occasional ectopy. No M/R/G. Nondisplaced PMI.  Lungs: CTAB, nonlabored, normal effort, good air movement.  Abdomen: Soft/NT/ND/NABS. No HSM.  Extremities: No C/C/E, Neuro: grossly intact.   Adult ECG Report  Rate: 61 ;  Rhythm: normal sinus rhythm and normal axis, intervals & durations;   Narrative Interpretation: normal EKG   Other studies Reviewed: Additional studies/ records that were reviewed today include:  Recent Labs: none this year Lab Results  Component Value Date   CHOL 125 12/11/2016   HDL 44 12/11/2016   LDLCALC 73 12/11/2016   TRIG 41 12/11/2016   CHOLHDL 2.8 12/11/2016   Lab Results  Component Value Date   CREATININE 1.14 12/11/2016   BUN 15 12/11/2016   NA 139 12/11/2016   K 4.0 12/11/2016   CL 103 12/11/2016   CO2 27 12/11/2016     ASSESSMENT / PLAN: Problem List Items Addressed This Visit    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 He may still get some chest discomfort with heavy exertion, but has not noticed it  since I last saw him. May be more related to the type of activity he  is doing. I did start Imdur last visit, and that may have been effective in keeping his angina at [place]. At this point since his symptoms of abated, we will pursue invasive evaluation unless symptoms recur.  Continue beta blocker, statin and Imdur. On aspirin but not additional antiplatelet control.      Relevant Medications   isosorbide mononitrate (IMDUR) 30 MG 24 hr tablet   metoprolol succinate (TOPROL-XL) 25 MG 24 hr tablet   Hyperlipidemia with target low density lipoprotein (LDL) cholesterol less than 70 mg/dL (Chronic)    On statin. His labs look pretty good for most recent check. Pretty much at goal.      Relevant Medications   isosorbide mononitrate (IMDUR) 30 MG 24 hr tablet   metoprolol succinate (TOPROL-XL) 25 MG 24 hr tablet   Essential hypertension (Chronic)    Controlled on low-dose beta blocker. We have been reluctant to increase his doses because of dizziness.      Relevant Medications   isosorbide mononitrate (IMDUR) 30 MG 24 hr tablet   metoprolol succinate (TOPROL-XL) 25 MG 24 hr tablet   DOE (dyspnea on exertion) (Chronic)    Multifactorial. He is not really able to do much exercise as far as cardio from a full-body excess standpoint because of his arthritis. He is doing better and certainly get back into better shape with the exercises.         Current medicines are reviewed at length with the patient today. (+/- concerns) None The following changes have been made:  See below  Patient Instructions  No change with current medications PRESCRIPTION  REFILL SENT TO [place] physician wants you to follow-up in 12 months with Dr Ellyn Hack. You will receive a reminder letter in the mail two months in advance. If you don't receive a letter, please call our office to schedule the follow-up appointment.    If you need a refill on your cardiac medications before your next appointment,  please call your pharmacy.  Studies Ordered:   No orders of the defined types were placed in this encounter.     Glenetta Hew, M.D., M.S. Interventional Cardiologist   Pager # [phone] Phone # [phone] [address] [place], [place] [address]

## 2016-12-27 ENCOUNTER — Encounter: Payer: Self-pay | Admitting: Cardiology

## 2016-12-27 NOTE — Assessment & Plan Note (Signed)
On statin. His labs look pretty good for most recent check. Pretty much at goal.

## 2016-12-27 NOTE — Assessment & Plan Note (Signed)
Pretty stable symptoms. He may still get some chest discomfort with heavy exertion, but has not noticed it since I last saw him. May be more related to the type of activity he is doing. I did start Imdur last visit, and that may have been effective in keeping his angina at [place]. At this point since his symptoms of abated, we will pursue invasive evaluation unless symptoms recur.  Continue beta blocker, statin and Imdur. On aspirin but not additional antiplatelet control.

## 2016-12-27 NOTE — Assessment & Plan Note (Signed)
Controlled on low-dose beta blocker. We have been reluctant to increase his doses because of dizziness.

## 2016-12-27 NOTE — Assessment & Plan Note (Signed)
Multifactorial. He is not really able to do much exercise as far as cardio from a full-body excess standpoint because of his arthritis. He is doing better and certainly get back into better shape with the exercises.

## 2017-04-06 ENCOUNTER — Other Ambulatory Visit: Payer: Self-pay | Admitting: Family Medicine

## 2017-05-11 DIAGNOSIS — L821 Other seborrheic keratosis: Secondary | ICD-10-CM | POA: Diagnosis not present

## 2017-05-11 DIAGNOSIS — D239 Other benign neoplasm of skin, unspecified: Secondary | ICD-10-CM | POA: Diagnosis not present

## 2017-05-11 DIAGNOSIS — D1801 Hemangioma of skin and subcutaneous tissue: Secondary | ICD-10-CM | POA: Diagnosis not present

## 2017-05-11 DIAGNOSIS — L400 Psoriasis vulgaris: Secondary | ICD-10-CM | POA: Diagnosis not present

## 2017-05-21 ENCOUNTER — Other Ambulatory Visit: Payer: Self-pay | Admitting: Family Medicine

## 2017-09-02 ENCOUNTER — Telehealth: Payer: Self-pay | Admitting: Cardiology

## 2017-09-02 NOTE — Telephone Encounter (Signed)
Spoke with pt, last week after taking a shower he developed a tightness in his chest and he noticed his heart beating fast. His bp was 134/118 pulse 140, 130/109 109,133/81 99, and 131/79 94. This lasted about 30 minutes. Yesterday he has a similar experience after walking to the barn. It only lasted about 5 minutes. Today he feels fine. He reports this is not the same type of heart pain he has had before and he does feel like the isosorbide has helped with his chest pain. He is worried and feels like he may have another blockage. Follow up scheduled for Monday with dr harding. Patient voiced understanding to call with problems prior to appt.

## 2017-09-02 NOTE — Telephone Encounter (Signed)
New message    Pt c/o of Chest Pain: STAT if CP now or developed within 24 hours  1. Are you having CP right now? No, just yesterday afternoon and last Thursday  2. Are you experiencing any other symptoms (ex. SOB, nausea, vomiting, sweating)? No   3. How long have you been experiencing CP? It started yesterday  4. Is your CP continuous or coming and going? Coming and going  5. Have you taken Nitroglycerin? No  ?

## 2017-09-06 ENCOUNTER — Encounter: Payer: Self-pay | Admitting: Cardiology

## 2017-09-06 ENCOUNTER — Ambulatory Visit (INDEPENDENT_AMBULATORY_CARE_PROVIDER_SITE_OTHER): Payer: BC Managed Care – PPO | Admitting: Cardiology

## 2017-09-06 VITALS — BP 128/76 | HR 83 | Ht 69.5 in | Wt 213.4 lb

## 2017-09-06 DIAGNOSIS — R079 Chest pain, unspecified: Secondary | ICD-10-CM

## 2017-09-06 DIAGNOSIS — I2510 Atherosclerotic heart disease of native coronary artery without angina pectoris: Secondary | ICD-10-CM | POA: Diagnosis not present

## 2017-09-06 DIAGNOSIS — R0600 Dyspnea, unspecified: Secondary | ICD-10-CM

## 2017-09-06 DIAGNOSIS — R0609 Other forms of dyspnea: Secondary | ICD-10-CM | POA: Diagnosis not present

## 2017-09-06 DIAGNOSIS — E785 Hyperlipidemia, unspecified: Secondary | ICD-10-CM

## 2017-09-06 DIAGNOSIS — N5201 Erectile dysfunction due to arterial insufficiency: Secondary | ICD-10-CM

## 2017-09-06 DIAGNOSIS — Z9861 Coronary angioplasty status: Secondary | ICD-10-CM

## 2017-09-06 DIAGNOSIS — I10 Essential (primary) hypertension: Secondary | ICD-10-CM | POA: Diagnosis not present

## 2017-09-06 DIAGNOSIS — I25118 Atherosclerotic heart disease of native coronary artery with other forms of angina pectoris: Secondary | ICD-10-CM | POA: Diagnosis not present

## 2017-09-06 MED ORDER — ISOSORBIDE MONONITRATE ER 60 MG PO TB24: 60.0000 mg | ORAL_TABLET | Freq: Every day | ORAL | 3 refills | Status: DC

## 2017-09-06 MED ORDER — METOPROLOL SUCCINATE ER 50 MG PO TB24: 50.0000 mg | ORAL_TABLET | Freq: Every day | ORAL | 3 refills | Status: DC

## 2017-09-06 NOTE — Patient Instructions (Signed)
MEDICATIONS   INCREASE  METOPROLOL SUCCINATE 50 MG ONE TABLET DAILY  INCREASE ISOSORBIDE MONO 60 MG ONE TABLET DAILY    SCHEDULE AT [place] [facility] ( LIKE TO SCHEDULE AFTER LUNCH) Your physician has requested that you have a lexiscan myoview. For further information please visit [email or web address]. Please follow instruction sheet, as given.    Your physician recommends that you schedule a follow-up appointment in [place]

## 2017-09-06 NOTE — Progress Notes (Signed)
PCP: Susy Frizzle, MD  Clinic Note: Chief Complaint  Patient presents with  . Follow-up    chest pain  . Coronary Artery Disease    PCI Cx & LAD    HPI: Brad Owen is a 71 y.o. male with a PMH below who presents today for ~44-month follow-up of CAD-PCI - with a complaint of recent chest discomfort & tachycardia. [name] He is a former patient of Dr. Aldona Bar. I first saw in July 2014 with a CAD history dating back to 79 - PTCA-BMS.  His last PCI was in 2003 with a Cypher DES to the OM. Repeat 2005 showed patent stents. Last Myoview was in the fall of 2016 that was low risk with no ischemia.  CORDAI RODRIGUE was last seen in January 2018 to follow-up his episodes of chest discomfort he was noticing in October 2017. The symptoms were nonlimiting and similar to what he had back in 2016 when he had a normal Myoview. There is doing relatively well at that time.  Recent Hospitalizations: none  Studies Reviewed: None  Interval History: Jiyan presents Today sooner than usually planned. He was doing quite well until this past Thursday when he notices significant chest tightness or pressure with dyspnea that started after his taking a shower. Since then he has noted exertional discomfort off and on. The episode he had that time lasted maybe 5-10 minutes. He noted that his heart rate when up significantly into the 140 bpm range. He felt beating forcefully and hard but not necessarily irregular. It scared him quite a bit. He is not had any further episodes of fast heartbeats, but has had some fatigue and dyspnea since then. He now also notes that he will get some chest tightness when he walks out to feed his horse that as it is not walking, descending that he had not had before. It's more the chest tightness with mild dyspnea and then around. He also notes some pain in his calf that happen when he is doing this walking. It does not keep him from doing that but does improve with rest. Next on he  also notes having difficulty maintaining an erection during intercourse.  He denies any PND, orthopnea or edema. No syncope/near-syncope or TIAs amaurosis fugax. No melena, hematochezia, hematuria or epistaxis.   Cardiovascular ROS: positive for - chest pain, dyspnea on exertion, palpitations and ED negative for - edema, irregular heartbeat, murmur, orthopnea, paroxysmal nocturnal dyspnea, rapid heart rate, shortness of breath or Syncope/near syncope, TIA/amaurosis fugax, claudication.   He does have some calf and thigh discomfort with walking, but has a hard time distinguishing this between his knee and hip pain. Not limiting.  ROS: A comprehensive was performed. Pertinent symptoms noted in history of present illness Review of Systems  Constitutional: Negative for fever, malaise/fatigue and weight loss.  HENT: Negative for congestion and nosebleeds.   Respiratory: Negative for cough, shortness of breath and wheezing.   Cardiovascular:       Per history of present illness  Gastrointestinal: Negative for blood in stool, constipation, diarrhea, heartburn and melena.  Genitourinary: Negative for hematuria.       ED  Musculoskeletal: Positive for joint pain (Knee and hip).  Skin: Negative.   Neurological: Negative for dizziness, loss of consciousness and headaches.  Endo/Heme/Allergies: Negative for environmental allergies.  Psychiatric/Behavioral: Negative for depression and memory loss. The patient has insomnia (Has a hard time going to sleep. He stays up quite a bit). The patient  is not nervous/anxious.   All other systems reviewed and are negative. - he thinks his arthritis pains are related to psoriatic arthritis   Past Medical History:  Diagnosis Date  . Asthma   . CAD S/P percutaneous coronary angioplasty 1998   BMS to OM, followed by DES in 2003: PTCA of a  side branch.; Patent stents in 2005.  [name] COPD (chronic obstructive pulmonary disease) ([place])   . Erectile dysfunction   .  GERD (gastroesophageal reflux disease)   . History of stress test 01/05/2004   abnormal stress test, positive bruce protocol exercise stress test with scintigraphic evidence of mild lateral wall ischemia at the apex. Dynamic gating reveals a preserved EF at 60% by QGS  . Hypercholesteremia   . Hypertension   . Psoriasis    Patchy psoriasis  . Psoriatic arthritis ([place])    hips    Past Surgical History:  Procedure Laterality Date  . CARDIAC CATHETERIZATION  03/1998   Normal left ventricular systolic function, Minor narrowing in the body of the stent, minor tapering in the distal RCA at the area of the PDA.  [name] CARDIAC CATHETERIZATION  02.2005   Normal left ventricular systolic function, patent stents in both the circumflex and left anterior descending with only minimal in-stent restenosis in the left anterior descending.  [name] CARDIAC SURGERY    . CHOLECYSTECTOMY  06/05/2014  . CORONARY ANGIOPLASTY  12/06/2001   OM: cutting balloon PTCA  . CORONARY STENT PLACEMENT  06/06/1997   90% Prox LAD - 3.0 x 18 mm AVE (GFX) BMS stent   . CORONARY STENT PLACEMENT  05/10/2002   LCx: Cypher DES 3. x 18, rescue Cutting Balloon PTCA of jailed Ostial OM   . NM MYOVIEW LTD  08/2015   Normal, low risk study. EF 55-60%.   Current Meds  Medication Sig  . Adalimumab (HUMIRA PEN) 40 MG/0.8ML PNKT Inject 40 mg into the skin daily.  [name] albuterol (PROVENTIL HFA;VENTOLIN HFA) 108 (90 BASE) MCG/ACT inhaler Inhale 2 puffs into the lungs every 6 (six) hours as needed for wheezing or shortness of breath.  [name] aspirin EC 81 MG tablet Take 81 mg by mouth daily.  . budesonide-formoterol (SYMBICORT) 160-4.5 MCG/ACT inhaler Inhale 2 puffs into the lungs 2 (two) times daily.  . fluticasone (FLONASE) 50 MCG/ACT nasal spray PLACE 2 SPRAYS INTO BOTH NOSTRILS DAILY AS NEEDED FOR ALLERGIES (CONGESTION).  [name] meloxicam (MOBIC) 15 MG tablet Take 15 mg by mouth daily.  . nitroGLYCERIN (NITROSTAT) 0.4 MG SL tablet Place 0.4 mg under the  tongue every 5 (five) minutes as needed for chest pain.  . pantoprazole (PROTONIX) 40 MG tablet TAKE 1 TABLET DAILY  . simvastatin (ZOCOR) 20 MG tablet TAKE 1 TABLET EVERY MORNING  . [DISCONTINUED] isosorbide mononitrate (IMDUR) 30 MG 24 hr tablet Take 30 mg by mouth daily.  . [DISCONTINUED] metoprolol succinate (TOPROL-XL) 25 MG 24 hr tablet Take 1 tablet (25 mg total) by mouth daily.    No Known Allergies   Social History   Social History  . Marital status: Married    Spouse name: N/A  . Number of children: N/A  . Years of education: N/A   Social History Main Topics  . Smoking status: Former Research scientist (life sciences)  . Smokeless tobacco: Current User    Types: Chew  . Alcohol use 0.0 oz/week     Comment: occassionally  . Drug use: No  . Sexual activity: Not Asked   Other Topics Concern  . None  Social History Narrative   He is a married father of 2. He has 3 stepchildren, he has 10 grandchildren and 2 great   Grandchildren. He chews tobacco, does not   smoke. He seldom drinks alcohol.    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 He walks well over the golf course without any significant difficulties. Besides this he really does get routine exercise.     Family History  Problem Relation Age of Onset  . Heart Problems Mother   . Diabetes Father   . Heart Problems Father   . Diabetes Sister      Wt Readings from Last 3 Encounters:  09/06/17 213 lb 6.4 oz (96.8 kg)  12/25/16 211 lb 3.2 oz (95.8 kg)  12/11/16 212 lb (96.2 kg)    PHYSICAL EXAM BP 128/76   Pulse 83   Ht 5' 9.5" (1.765 m)   Wt 213 lb 6.4 oz (96.8 kg)   SpO2 94%   BMI 31.06 kg/m   Physical Exam  Constitutional: He appears well-developed and well-nourished. No distress.  Healthy-appearing  HENT:  Head: Normocephalic and atraumatic.  Eyes: EOM are normal.  Neck: Normal range of motion. Neck  supple. No hepatojugular reflux and no JVD present. Carotid bruit is not present. No thyromegaly present.  Cardiovascular: Normal rate, regular rhythm and intact distal pulses.  PMI is not displaced.  Exam reveals no gallop and no friction rub.   No murmur heard. Pulmonary/Chest: Effort normal and breath sounds normal. No respiratory distress. He has no wheezes. He has no rales.  Abdominal: Soft. Bowel sounds are normal. He exhibits no distension. There is no tenderness. There is no rebound.  Musculoskeletal: Normal range of motion. He exhibits no edema.  Neurological: He is alert. No cranial nerve deficit.  Skin: Skin is warm and dry.  Psychiatric: He has a normal mood and affect. His behavior is normal. Judgment and thought content normal.  Nursing note and vitals reviewed.  Adult ECG Report  Rate: 70;  Rhythm: normal sinus rhythm, sinus arrhythmia and normal axis, intervals & durations;   Narrative Interpretation: normal EKG   Other studies Reviewed: Additional studies/ records that were reviewed today include:  Recent Labs: none since earlier this year Lab Results  Component Value Date   CHOL 125 12/11/2016   HDL 44 12/11/2016   LDLCALC 73 12/11/2016   TRIG 41 12/11/2016   CHOLHDL 2.8 12/11/2016   Lab Results  Component Value Date   CREATININE 1.14 12/11/2016   BUN 15 12/11/2016   NA 139 12/11/2016   K 4.0 12/11/2016   CL 103 12/11/2016   CO2 27 12/11/2016     ASSESSMENT / PLAN: Problem List Items Addressed This Visit    CAD S/P percutaneous coronary angioplasty (Chronic)    Most recent PCI was in 2003 with a Cypher DES) that 1998 with a bare-metal stent. Stents were open in 2005. He had a Myoview in September 2016 that was negative. Now with recurrent symptoms, I do think we will proceed with a Myoview with low threshold to consider cardiac catheterization. Excellent continue aspirin and statin along with beta blocker and Imdur. I am titrating up both Imdur and  Toprol.      Relevant Medications   nitroGLYCERIN (NITROSTAT) 0.4 MG SL tablet   metoprolol succinate (TOPROL-XL) 50 MG 24 hr tablet   isosorbide mononitrate (  IMDUR) 60 MG 24 hr tablet   Chest pain with moderate risk for cardiac etiology - Primary    Very extensive set of circumstances that he has been noticing since last week. It almost sounds as the episode he had last week could've been an arrhythmia. May consider having him wear a monitor pending the results of his stress test.  With him now having this one prolonged episode at rest as well as now chest tightness walking to his horse, I think we need to exclude ischemia with a Myoview stress test.  Since there was rapid heartbeat associated with it and increase his metoprolol to 50 twice a day and his Imdur 60 mg.  He'll otherwise continue aspirin and statin.      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 More concerning however was the resting episode that he had.  Evaluating with Myoview stress test increasing Imdur and Toprol dose.      Relevant Medications   nitroGLYCERIN (NITROSTAT) 0.4 MG SL tablet   metoprolol succinate (TOPROL-XL) 50 MG 24 hr tablet   isosorbide mononitrate (IMDUR) 60 MG 24 hr tablet   DOE (dyspnea on exertion) (Chronic)    Exertional dyspnea remains multifactorial. We are performing a Myoview to exclude ischemia. He is otherwise limited by arthritis pains.      Erectile dysfunction    Was reluctant to use Viagra in the past. This point I would like to see what is on her disease status is before we consider different options.      Essential hypertension (Chronic)    The blood pressure actually was pretty good. He does have room I will titrate up his beta  blocker dose given the fact he had the tachycardia episode. No further dizziness episodes.      Relevant Medications   nitroGLYCERIN (NITROSTAT) 0.4 MG SL tablet   metoprolol succinate (TOPROL-XL) 50 MG 24 hr tablet   isosorbide mononitrate (IMDUR) 60 MG 24 hr tablet   Hyperlipidemia with target low density lipoprotein (LDL) cholesterol less than 70 mg/dL (Chronic)    Currently on statin. Labs are just checked by the [place]. Unfortunately don't have the results.      Relevant Medications   nitroGLYCERIN (NITROSTAT) 0.4 MG SL tablet   metoprolol succinate (TOPROL-XL) 50 MG 24 hr tablet   isosorbide mononitrate (IMDUR) 60 MG 24 hr tablet      Current medicines are reviewed at length with the patient today. (+/- concerns) None The following changes have been made:  See below  Patient Instructions  MEDICATIONS   INCREASE  METOPROLOL SUCCINATE 50 MG ONE TABLET DAILY  INCREASE ISOSORBIDE MONO 60 MG ONE TABLET DAILY    SCHEDULE AT [place] [address] ( LIKE TO SCHEDULE AFTER LUNCH) Your physician has requested that you have a lexiscan myoview. For further information please visit [email or web address]. Please follow instruction sheet, as given.    Your physician recommends that you schedule a follow-up appointment in 3 WEEKS      Studies Ordered:   Orders Placed This Encounter  Procedures  . MYOCARDIAL PERFUSION IMAGING  . EKG 12-Lead      Glenetta Hew, M.D., M.S. Interventional Cardiologist   Pager # [phone] Phone #  [address] [place], [place] [address]

## 2017-09-07 ENCOUNTER — Telehealth: Payer: Self-pay | Admitting: Cardiology

## 2017-09-07 NOTE — Telephone Encounter (Signed)
Left message for patient to call back.  He needs to schedule a lexiscan and follow up appt with Dr. Ellyn Hack in 3 weeks.

## 2017-09-08 ENCOUNTER — Encounter: Payer: Self-pay | Admitting: Cardiology

## 2017-09-08 DIAGNOSIS — R079 Chest pain, unspecified: Secondary | ICD-10-CM | POA: Insufficient documentation

## 2017-09-08 NOTE — Assessment & Plan Note (Signed)
He has always had a little stable angina, but this is a little bit less than what he is noticing now walking to his horse. More concerning however was the resting episode that he had.  Evaluating with Myoview stress test increasing Imdur and Toprol dose.

## 2017-09-08 NOTE — Assessment & Plan Note (Signed)
Very extensive set of circumstances that he has been noticing since last week. It almost sounds as the episode he had last week could've been an arrhythmia. May consider having him wear a monitor pending the results of his stress test.  With him now having this one prolonged episode at rest as well as now chest tightness walking to his horse, I think we need to exclude ischemia with a Myoview stress test.  Since there was rapid heartbeat associated with it and increase his metoprolol to 50 twice a day and his Imdur 60 mg.  He'll otherwise continue aspirin and statin.

## 2017-09-08 NOTE — Assessment & Plan Note (Signed)
Most recent PCI was in 2003 with a Cypher DES) that 1998 with a bare-metal stent. Stents were open in 2005. He had a Myoview in September 2016 that was negative. Now with recurrent symptoms, I do think we will proceed with a Myoview with low threshold to consider cardiac catheterization. Excellent continue aspirin and statin along with beta blocker and Imdur. I am titrating up both Imdur and Toprol.

## 2017-09-08 NOTE — Assessment & Plan Note (Signed)
Currently on statin. Labs are just checked by the [place]. Unfortunately don't have the results.

## 2017-09-08 NOTE — Assessment & Plan Note (Signed)
Exertional dyspnea remains multifactorial. We are performing a Myoview to exclude ischemia. He is otherwise limited by arthritis pains.

## 2017-09-08 NOTE — Assessment & Plan Note (Signed)
Was reluctant to use Viagra in the past. This point I would like to see what is on her disease status is before we consider different options.

## 2017-09-08 NOTE — Assessment & Plan Note (Signed)
The blood pressure actually was pretty good. He does have room I will titrate up his beta blocker dose given the fact he had the tachycardia episode. No further dizziness episodes.

## 2017-09-10 ENCOUNTER — Telehealth (HOSPITAL_COMMUNITY): Payer: Self-pay

## 2017-09-10 NOTE — Telephone Encounter (Signed)
UTR Encounter complete. 

## 2017-09-15 ENCOUNTER — Ambulatory Visit (HOSPITAL_COMMUNITY)
Admission: RE | Admit: 2017-09-15 | Discharge: 2017-09-15 | Disposition: A | Discharge status: Home / Self Care | Payer: Medicare Other | Source: Ambulatory Visit | Attending: Cardiovascular Disease | Admitting: Cardiovascular Disease

## 2017-09-15 DIAGNOSIS — Z6831 Body mass index (BMI) 31.0-31.9, adult: Secondary | ICD-10-CM | POA: Insufficient documentation

## 2017-09-15 DIAGNOSIS — R079 Chest pain, unspecified: Secondary | ICD-10-CM

## 2017-09-15 DIAGNOSIS — I10 Essential (primary) hypertension: Secondary | ICD-10-CM | POA: Diagnosis not present

## 2017-09-15 DIAGNOSIS — Z8249 Family history of ischemic heart disease and other diseases of the circulatory system: Secondary | ICD-10-CM | POA: Insufficient documentation

## 2017-09-15 DIAGNOSIS — R0609 Other forms of dyspnea: Secondary | ICD-10-CM | POA: Diagnosis not present

## 2017-09-15 DIAGNOSIS — J449 Chronic obstructive pulmonary disease, unspecified: Secondary | ICD-10-CM | POA: Insufficient documentation

## 2017-09-15 DIAGNOSIS — R5383 Other fatigue: Secondary | ICD-10-CM | POA: Diagnosis not present

## 2017-09-15 DIAGNOSIS — E663 Overweight: Secondary | ICD-10-CM | POA: Diagnosis not present

## 2017-09-15 DIAGNOSIS — Z87891 Personal history of nicotine dependence: Secondary | ICD-10-CM | POA: Insufficient documentation

## 2017-09-15 DIAGNOSIS — R002 Palpitations: Secondary | ICD-10-CM | POA: Diagnosis not present

## 2017-09-15 LAB — MYOCARDIAL PERFUSION IMAGING
LVDIAVOL: 124 mL (ref 62–150)
LVSYSVOL: 53 mL
NUC STRESS TID: 1.03
Peak HR: 74 {beats}/min
Rest HR: 51 {beats}/min
SDS: 4
SRS: 1
SSS: 5

## 2017-09-15 MED ORDER — TECHNETIUM TC 99M TETROFOSMIN IV KIT
29.5000 | PACK | Freq: Once | INTRAVENOUS | Status: AC | PRN
  Administered 2017-09-15: 29.5 via INTRAVENOUS
  Filled 2017-09-15: qty 30

## 2017-09-15 MED ORDER — REGADENOSON 0.4 MG/5ML IV SOLN
0.4000 mg | Freq: Once | INTRAVENOUS | Status: AC
  Administered 2017-09-15: 0.4 mg via INTRAVENOUS

## 2017-09-15 MED ORDER — TECHNETIUM TC 99M TETROFOSMIN IV KIT
10.4000 | PACK | Freq: Once | INTRAVENOUS | Status: AC | PRN
  Administered 2017-09-15: 10.4 via INTRAVENOUS
  Filled 2017-09-15: qty 11

## 2017-09-23 NOTE — Progress Notes (Signed)
Stress Test looked good!! No sign of significant Heart Artery Disease.  Pump function is normal.  Good news!!.  Mustapha Colson W, MD 

## 2017-09-24 ENCOUNTER — Telehealth: Payer: Self-pay | Admitting: *Deleted

## 2017-09-24 NOTE — Telephone Encounter (Signed)
Spoke to patient. Result given . Verbalized understanding  patient still having symptoms.  patient does not have followup appointment- due to fact if abnormal would schedule cath , but test is normal , so does what is next step patient wants to know?   Patient aware will defer to [facility]?

## 2017-09-24 NOTE — Telephone Encounter (Signed)
-----   Message from Leonie Man, MD sent at 09/23/2017 12:45 PM EDT ----- Stress Test looked good!! No sign of significant Heart Artery Disease.  Pump function is normal.  Good news!!.  Leonie Man, MD

## 2017-09-24 NOTE — Telephone Encounter (Signed)
Left message to call back on voice and cell phone.results of test and make an appointment for follow up

## 2017-09-24 NOTE — Telephone Encounter (Signed)
Returning your call. °

## 2017-09-28 NOTE — Telephone Encounter (Signed)
The plan was for him to follow-up with me regardless of stress test results.  He should have had an appointment to see me in 3 weeks after the most recent visit.  I have thought about potentially having him wear a monitor even potentially considering cath.  We need to discuss his symptoms.  Glenetta Hew, MD

## 2017-09-30 NOTE — Telephone Encounter (Signed)
Spoke to wife.  Appointment made to discuss test results 10/07/17

## 2017-10-07 ENCOUNTER — Encounter: Payer: Self-pay | Admitting: Cardiology

## 2017-10-07 ENCOUNTER — Encounter: Payer: Self-pay | Admitting: *Deleted

## 2017-10-07 ENCOUNTER — Ambulatory Visit (INDEPENDENT_AMBULATORY_CARE_PROVIDER_SITE_OTHER): Payer: Medicare Other | Admitting: Cardiology

## 2017-10-07 VITALS — BP 124/62 | HR 75 | Ht 69.0 in | Wt 212.0 lb

## 2017-10-07 DIAGNOSIS — Z9861 Coronary angioplasty status: Secondary | ICD-10-CM

## 2017-10-07 DIAGNOSIS — I25118 Atherosclerotic heart disease of native coronary artery with other forms of angina pectoris: Secondary | ICD-10-CM | POA: Diagnosis not present

## 2017-10-07 DIAGNOSIS — I10 Essential (primary) hypertension: Secondary | ICD-10-CM | POA: Diagnosis not present

## 2017-10-07 DIAGNOSIS — I2510 Atherosclerotic heart disease of native coronary artery without angina pectoris: Secondary | ICD-10-CM | POA: Diagnosis not present

## 2017-10-07 DIAGNOSIS — R0609 Other forms of dyspnea: Secondary | ICD-10-CM

## 2017-10-07 DIAGNOSIS — E785 Hyperlipidemia, unspecified: Secondary | ICD-10-CM

## 2017-10-07 DIAGNOSIS — R079 Chest pain, unspecified: Secondary | ICD-10-CM

## 2017-10-07 DIAGNOSIS — R0600 Dyspnea, unspecified: Secondary | ICD-10-CM

## 2017-10-07 NOTE — Patient Instructions (Signed)
No changes with medication or treatment    Your physician wants you to follow-up in 12 month with Dr Ellyn Hack. You will receive a reminder letter in the mail two months in advance. If you don't receive a letter, please call our office to schedule the follow-up appointment.    If you need a refill on your cardiac medications before your next appointment, please call your pharmacy.

## 2017-10-07 NOTE — Progress Notes (Signed)
PCP: Susy Frizzle, MD  Clinic Note: Chief Complaint  Patient presents with  . Follow-up    Stress Test results - for episodes of  CP.  [name] Coronary Artery Disease    HPI: Brad Owen is a 71 y.o. male with a PMH below who presents today for ~23-month follow-up of CAD-PCI - with a complaint of recent chest discomfort & tachycardia. [name] He is a former patient of Dr. Aldona Bar. I first saw in July 2014 with a CAD history dating back to 19 - PTCA-BMS.  His last PCI was in 2003 with a Cypher DES to the OM. Repeat 2005 showed patent stents. Last Myoview was in the fall of 2016 that was low risk with no ischemia.  Brad Owen was last seen on September 06, 2017 to stating that he had an episode a couple days prior when he noticed significant chest tightness or pressure with dyspnea that started after his taking a shower. Since then he has noted exertional discomfort off and on. The episode he had that time lasted maybe 5-10 minutes. He noted that his heart rate when up significantly into the 140 bpm range. He felt beating forcefully and hard but not necessarily irregular.  Recent Hospitalizations: none  Studies Reviewed:  NUCLEAR ST - Oct 2018: Low risk stress nuclear study with no ischemia or infarction; EF 57 with normal wall motion The left ventricular ejection fraction is normal (55-65%).  Nuclear stress EF: 57%.  There was no ST segment deviation noted during stress.  This is a low risk study.  Interval History: Brad Owen presents today to follow-up after his recent evaluation for chest pain and fatigue.  He came in for more earlier than usual appointment.  He still complaints of feeling tired and fatigued all the time, but he has not had any further chest pain or pressure with rest or exertion. He does have some psoriatic arthritis pains that limit his walking.  Despite this, he still works and is just here from work. No further episodes of chest tightness pressure with rest or  exertion --no longer noting the chest burning sensation that he had been having. .  No heart failure symptoms of PND, orthopnea or edema.  Still has some mild calf pain with walking, but does not keep him from doing what he wants to do.   No melena, hematochezia, hematuria or epistaxis.  Cardiovascular ROS: positive for - dyspnea on exertion, palpitations and ED, fatigue negative for - edema, irregular heartbeat, loss of consciousness, murmur, orthopnea, palpitations, paroxysmal nocturnal dyspnea, rapid heart rate, shortness of breath or Syncope/near syncope, TIA/amaurosis fugax, claudication.   He does have some calf and thigh discomfort with walking, but has a hard time distinguishing this between his knee and hip pain. Not limiting.  ROS: A comprehensive was performed. Pertinent symptoms noted in history of present illness Review of Systems  Constitutional: Negative for fever, malaise/fatigue and weight loss.  HENT: Negative for congestion and nosebleeds.   Respiratory: Negative for cough, shortness of breath and wheezing.   Cardiovascular:       Per history of present illness  Gastrointestinal: Negative for blood in stool, constipation, diarrhea, heartburn and melena.  Genitourinary: Negative for hematuria.       ED  Musculoskeletal: Positive for joint pain (Knee and hip).  Skin: Negative.   Neurological: Negative for dizziness, loss of consciousness and headaches.  Endo/Heme/Allergies: Negative for environmental allergies.  Psychiatric/Behavioral: Negative for depression and memory loss. The patient  has insomnia (Has a hard time going to sleep. He stays up quite a bit). The patient is not nervous/anxious.   All other systems reviewed and are negative. - he thinks his arthritis pains are related to psoriatic arthritis   Past Medical History:  Diagnosis Date  . Asthma   . CAD S/P percutaneous coronary angioplasty 1998   BMS to OM, followed by DES in 2003: PTCA of a  side branch.;  Patent stents in 2005.  [name] COPD (chronic obstructive pulmonary disease) ([place])   . Erectile dysfunction   . GERD (gastroesophageal reflux disease)   . History of stress test 01/05/2004   abnormal stress test, positive bruce protocol exercise stress test with scintigraphic evidence of mild lateral wall ischemia at the apex. Dynamic gating reveals a preserved EF at 60% by QGS  . Hypercholesteremia   . Hypertension   . Psoriasis    Patchy psoriasis  . Psoriatic arthritis ([place])    hips    Past Surgical History:  Procedure Laterality Date  . CARDIAC CATHETERIZATION  03/1998   Normal left ventricular systolic function, Minor narrowing in the body of the stent, minor tapering in the distal RCA at the area of the PDA.  [name] CARDIAC CATHETERIZATION  02.2005   Normal left ventricular systolic function, patent stents in both the circumflex and left anterior descending with only minimal in-stent restenosis in the left anterior descending.  . CHOLECYSTECTOMY  06/05/2014  . CORONARY ANGIOPLASTY  12/06/2001   OM: cutting balloon PTCA  . CORONARY STENT PLACEMENT  06/06/1997   90% Prox LAD - 3.0 x 18 mm AVE (GFX) BMS stent   . CORONARY STENT PLACEMENT  05/10/2002   LCx: Cypher DES 3. x 18, rescue Cutting Balloon PTCA of jailed Ostial OM   . NM MYOVIEW LTD  08/2015   Normal, low risk study. EF 55-60%.   Current Meds  Medication Sig  . Adalimumab (HUMIRA PEN) 40 MG/0.8ML PNKT Inject 40 mg as directed into the skin.   [name] albuterol (PROVENTIL HFA;VENTOLIN HFA) 108 (90 BASE) MCG/ACT inhaler Inhale 2 puffs into the lungs every 6 (six) hours as needed for wheezing or shortness of breath.  [name] aspirin EC 81 MG tablet Take 81 mg by mouth daily.  . budesonide-formoterol (SYMBICORT) 160-4.5 MCG/ACT inhaler Inhale 2 puffs into the lungs 2 (two) times daily.  . fluticasone (FLONASE) 50 MCG/ACT nasal spray PLACE 2 SPRAYS INTO BOTH NOSTRILS DAILY AS NEEDED FOR ALLERGIES (CONGESTION).  [name] isosorbide mononitrate (IMDUR) 60  MG 24 hr tablet Take 1 tablet (60 mg total) by mouth daily.  . meloxicam (MOBIC) 15 MG tablet Take 15 mg by mouth daily.  . metoprolol succinate (TOPROL-XL) 50 MG 24 hr tablet Take 1 tablet (50 mg total) by mouth daily. Take with or immediately following a meal.  . nitroGLYCERIN (NITROSTAT) 0.4 MG SL tablet Place 0.4 mg under the tongue every 5 (five) minutes as needed for chest pain.  . pantoprazole (PROTONIX) 40 MG tablet TAKE 1 TABLET DAILY  . simvastatin (ZOCOR) 20 MG tablet TAKE 1 TABLET EVERY MORNING    No Known Allergies   Social History   Socioeconomic History  . Marital status: Married    Spouse name: None  . Number of children: None  . Years of education: None  . Highest education level: None  Social Needs  . Financial resource strain: None  . Food insecurity - worry: None  . Food insecurity - inability: None  . Transportation needs - medical:  None  . Transportation needs - non-medical: None  Occupational History  . None  Tobacco Use  . Smoking status: Former Research scientist (life sciences)  . Smokeless tobacco: Current User    Types: Chew  Substance and Sexual Activity  . Alcohol use: Yes    Alcohol/week: 0.0 oz    Comment: occassionally  . Drug use: No  . Sexual activity: None  Other Topics Concern  . None  Social History Narrative   He is a married father of 2. He has 3 stepchildren, he has 10 grandchildren and 2 great   Grandchildren. He chews tobacco, does not   smoke. He seldom drinks alcohol.    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 He walks well over the golf course without any significant difficulties. Besides this he really does get routine exercise.     Family History  Problem Relation Age of Onset  . Heart Problems Mother   . Diabetes Father   . Heart Problems Father   . Diabetes Sister     Wt Readings from Last 3 Encounters:  10/07/17 212 lb (96.2  kg)  09/15/17 213 lb (96.6 kg)  09/06/17 213 lb 6.4 oz (96.8 kg)    PHYSICAL EXAM BP 124/62   Pulse 75   Ht 5\' 9"  (1.753 m)   Wt 212 lb (96.2 kg)   SpO2 97%   BMI 31.31 kg/m   Physical Exam  Constitutional: He appears well-developed and well-nourished. No distress.  Healthy-appearing  HENT:  Head: Normocephalic and atraumatic.  Eyes: EOM are normal.  Neck: Normal range of motion. Neck supple. No hepatojugular reflux and no JVD present. Carotid bruit is not present. Thyromegaly present.  Cardiovascular: Normal rate, regular rhythm and intact distal pulses. PMI is not displaced. Exam reveals no gallop and no friction rub.  No murmur heard. Pulmonary/Chest: Effort normal and breath sounds normal. No respiratory distress. He has no wheezes. He has no rales.  Abdominal: Soft. Bowel sounds are normal. He exhibits no distension. There is no tenderness. There is no rebound.  Musculoskeletal: Normal range of motion. He exhibits no edema.  Neurological: He is alert. No cranial nerve deficit.  Skin: Skin is warm and dry.  Psychiatric: He has a normal mood and affect. His behavior is normal. Judgment and thought content normal.  Nursing note and vitals reviewed.  Adult ECG Report n/a  Other studies Reviewed: Additional studies/ records that were reviewed today include:  Recent Labs: just checked @ [place].  (was told TC 130)  Lab Results  Component Value Date   CHOL 125 12/11/2016   HDL 44 12/11/2016   LDLCALC 73 12/11/2016   TRIG 41 12/11/2016   CHOLHDL 2.8 12/11/2016   Lab Results  Component Value Date   CREATININE 1.14 12/11/2016   BUN 15 12/11/2016   NA 139 12/11/2016   K 4.0 12/11/2016   CL 103 12/11/2016   CO2 27 12/11/2016     ASSESSMENT / PLAN: Problem List Items Addressed This Visit    CAD S/P percutaneous coronary angioplasty - Primary (Chronic)    Last PCI was in 2003 -prior to that he had a bare-metal stent in 1998.  By cath in 2005 there were patent..  Myoview  now negative in September 2016 as well as October 2018.  At this point, if he were to have recurrent episodes of chest pain, I  think our best option would be to proceed with catheterization if concerning..      Chest pain with moderate risk for cardiac etiology (Chronic)    Likely not microvascular in nature.  He is on increased dose of Imdur and metoprolol.  He states that since we did this change, he has not had any more chest pain like he did prior to his last visit.  It seems better now that his anginal CP is controlled.      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  But Myoview showed no evidence of ischemia.  Also noted normal EF. He is on aspirin and statin (albeit low dose) along with Toprol XL and Imdur.  Reluctant to bring down the Toprol dose because of fatigue since it is helping his angina.      DOE (dyspnea on exertion) (Chronic)    Nonischemic Myoview with normal EF.  Perhaps there could be some component of diastolic dysfunction mediated exertional dyspnea, however usually with more strenuous exertion.      Essential hypertension (Chronic)    Blood pressure looks good today on current meds.  No change.      Hyperlipidemia with target low density lipoprotein (LDL) cholesterol less than 70 mg/dL (Chronic)    Currently on low-dose simvastatin.  I do not have labs readily available unfortunately. I suspect that he may require more than 20 mg simvastatin for adequate control, but will [facility].         Current medicines are reviewed at length with the patient today. (+/- concerns) None The following changes have been made:  See below  Patient Instructions  No changes with medication or treatment    Your physician wants you to follow-up in 12 month with Dr Ellyn Hack. You will receive a reminder letter in the mail two months in advance. If you don't  receive a letter, please call our office to schedule the follow-up appointment.    If you need a refill on your cardiac medications before your next appointment, please call your pharmacy.      Studies Ordered:   No orders of the defined types were placed in this encounter.     Glenetta Hew, M.D., M.S. Interventional Cardiologist   Pager # [phone] Phone # [phone] [address] [place], [place] [address]

## 2017-10-09 ENCOUNTER — Encounter: Payer: Self-pay | Admitting: Cardiology

## 2017-10-09 NOTE — Assessment & Plan Note (Signed)
Last PCI was in 2003 -prior to that he had a bare-metal stent in 1998.  By cath in 2005 there were patent..  Myoview now negative in September 2016 as well as October 2018.  At this point, if he were to have recurrent episodes of chest pain, I think our best option would be to proceed with catheterization if concerning.[name]

## 2017-10-09 NOTE — Assessment & Plan Note (Signed)
Likely not microvascular in nature.  He is on increased dose of Imdur and metoprolol.  He states that since we did this change, he has not had any more chest pain like he did prior to his last visit.  It seems better now that his anginal CP is controlled.

## 2017-10-09 NOTE — Assessment & Plan Note (Signed)
Currently on low-dose simvastatin.  I do not have labs readily available unfortunately. I suspect that he may require more than 20 mg simvastatin for adequate control, but will [facility].

## 2017-10-09 NOTE — Assessment & Plan Note (Signed)
Nonischemic Myoview with normal EF.  Perhaps there could be some component of diastolic dysfunction mediated exertional dyspnea, however usually with more strenuous exertion.

## 2017-10-09 NOTE — Assessment & Plan Note (Signed)
Blood pressure looks good today on current meds.  No change 

## 2017-10-09 NOTE — Assessment & Plan Note (Signed)
He always has some mild episodes of chest tightness or discomfort.  But Myoview showed no evidence of ischemia.  Also noted normal EF. He is on aspirin and statin (albeit low dose) along with Toprol XL and Imdur.  Reluctant to bring down the Toprol dose because of fatigue since it is helping his angina.

## 2017-11-11 DIAGNOSIS — D225 Melanocytic nevi of trunk: Secondary | ICD-10-CM | POA: Diagnosis not present

## 2017-11-11 DIAGNOSIS — L570 Actinic keratosis: Secondary | ICD-10-CM | POA: Diagnosis not present

## 2017-11-11 DIAGNOSIS — L0291 Cutaneous abscess, unspecified: Secondary | ICD-10-CM | POA: Diagnosis not present

## 2017-11-11 DIAGNOSIS — L400 Psoriasis vulgaris: Secondary | ICD-10-CM | POA: Diagnosis not present

## 2017-11-12 ENCOUNTER — Encounter: Payer: Self-pay | Admitting: Family Medicine

## 2018-01-10 ENCOUNTER — Other Ambulatory Visit: Payer: Self-pay | Admitting: Cardiology

## 2018-01-10 NOTE — Telephone Encounter (Signed)
REFILL 

## 2018-01-19 DIAGNOSIS — L249 Irritant contact dermatitis, unspecified cause: Secondary | ICD-10-CM | POA: Diagnosis not present

## 2018-01-24 ENCOUNTER — Encounter: Payer: Self-pay | Admitting: Family Medicine

## 2018-01-24 ENCOUNTER — Ambulatory Visit (INDEPENDENT_AMBULATORY_CARE_PROVIDER_SITE_OTHER): Payer: Medicare Other | Admitting: Family Medicine

## 2018-01-24 VITALS — BP 122/58 | HR 64 | Temp 98.1°F | Resp 16 | Ht 69.0 in | Wt 216.0 lb

## 2018-01-24 DIAGNOSIS — R1030 Lower abdominal pain, unspecified: Secondary | ICD-10-CM | POA: Diagnosis not present

## 2018-01-24 LAB — URINALYSIS, ROUTINE W REFLEX MICROSCOPIC
BACTERIA UA: NONE SEEN /HPF
Bilirubin Urine: NEGATIVE
GLUCOSE, UA: NEGATIVE
Ketones, ur: NEGATIVE
LEUKOCYTES UA: NEGATIVE
Nitrite: NEGATIVE
PH: 6 (ref 5.0–8.0)
Protein, ur: NEGATIVE
Specific Gravity, Urine: 1.015 (ref 1.001–1.03)
Squamous Epithelial / LPF: NONE SEEN /HPF (ref <=5)
WBC, UA: NONE SEEN /HPF (ref 0–5)

## 2018-01-24 LAB — MICROSCOPIC MESSAGE

## 2018-01-24 MED ORDER — ZOLPIDEM TARTRATE 5 MG PO TABS: 5.0000 mg | ORAL_TABLET | Freq: Every evening | ORAL | 1 refills | Status: DC | PRN

## 2018-01-24 MED ORDER — CIPROFLOXACIN HCL 500 MG PO TABS: 500.0000 mg | ORAL_TABLET | Freq: Two times a day (BID) | ORAL | 0 refills | Status: DC

## 2018-01-24 NOTE — Addendum Note (Signed)
Addended by: Shary Decamp B on: 01/24/2018 04:08 PM   Modules accepted: Orders

## 2018-01-24 NOTE — Progress Notes (Signed)
Subjective:    Patient ID: Brad Owen, male    DOB: [birth year], 72 y.o.   MRN: [id number]  HPI  Patient presents with 10 days of pain and pressure directly over his bladder over his bladder.  Reports mild dysuria.  Has been on bactrim for a skin infection and it is not helping pain.  Denies frequency, urgency, but does have hesitancy and weak stream.  Prostate is tender on palpation.  Denies constipation, diarrhea, change in stool, melena or hematochezia.  Denies fever.  Has history of prostatitis and this feels similar per his report. Past Medical History:  Diagnosis Date  . Asthma   . CAD S/P percutaneous coronary angioplasty 1998   BMS to OM, followed by DES in 2003: PTCA of a  side branch.; Patent stents in 2005.  [name] COPD (chronic obstructive pulmonary disease) ([place])   . Erectile dysfunction   . GERD (gastroesophageal reflux disease)   . History of stress test 01/05/2004   abnormal stress test, positive bruce protocol exercise stress test with scintigraphic evidence of mild lateral wall ischemia at the apex. Dynamic gating reveals a preserved EF at 60% by QGS  . Hypercholesteremia   . Hypertension   . Psoriasis    Patchy psoriasis  . Psoriatic arthritis ([place])    hips   Past Surgical History:  Procedure Laterality Date  . CARDIAC CATHETERIZATION  03/1998   Normal left ventricular systolic function, Minor narrowing in the body of the stent, minor tapering in the distal RCA at the area of the PDA.  [name] CARDIAC CATHETERIZATION  02.2005   Normal left ventricular systolic function, patent stents in both the circumflex and left anterior descending with only minimal in-stent restenosis in the left anterior descending.  . CHOLECYSTECTOMY  06/05/2014  . CORONARY ANGIOPLASTY  12/06/2001   OM: cutting balloon PTCA  . CORONARY STENT PLACEMENT  06/06/1997   90% Prox LAD - 3.0 x 18 mm AVE (GFX) BMS stent   . CORONARY STENT PLACEMENT  05/10/2002   LCx: Cypher DES 3. x 18, rescue Cutting  Balloon PTCA of jailed Ostial OM   . NM MYOVIEW LTD  08/2015   Normal, low risk study. EF 55-60%.   Current Outpatient Medications on File Prior to Visit  Medication Sig Dispense Refill  . Adalimumab (HUMIRA PEN) 40 MG/0.8ML PNKT Inject 40 mg as directed into the skin.     [name] albuterol (PROVENTIL HFA;VENTOLIN HFA) 108 (90 BASE) MCG/ACT inhaler Inhale 2 puffs into the lungs every 6 (six) hours as needed for wheezing or shortness of breath. 1 Inhaler 5  . aspirin EC 81 MG tablet Take 81 mg by mouth daily.    . budesonide-formoterol (SYMBICORT) 160-4.5 MCG/ACT inhaler Inhale 2 puffs into the lungs 2 (two) times daily. 1 Inhaler 3  . fluticasone (FLONASE) 50 MCG/ACT nasal spray PLACE 2 SPRAYS INTO BOTH NOSTRILS DAILY AS NEEDED FOR ALLERGIES (CONGESTION). 16 g 5  . isosorbide mononitrate (IMDUR) 30 MG 24 hr tablet TAKE 1 TABLET DAILY 30     MINUTES AFTER DAILY ASPIRINDOSE 90 tablet 3  . meloxicam (MOBIC) 15 MG tablet Take 15 mg by mouth daily.  1  . metoprolol succinate (TOPROL-XL) 50 MG 24 hr tablet Take 1 tablet (50 mg total) by mouth daily. Take with or immediately following a meal. 90 tablet 3  . nitroGLYCERIN (NITROSTAT) 0.4 MG SL tablet Place 0.4 mg under the tongue every 5 (five) minutes as needed for chest pain.    [name]  pantoprazole (PROTONIX) 40 MG tablet TAKE 1 TABLET DAILY 90 tablet 3  . simvastatin (ZOCOR) 20 MG tablet TAKE 1 TABLET EVERY MORNING 90 tablet 3  . isosorbide mononitrate (IMDUR) 60 MG 24 hr tablet Take 1 tablet (60 mg total) by mouth daily. 90 tablet 3   No current facility-administered medications on file prior to visit.    No Known Allergies Social History   Socioeconomic History  . Marital status: Married    Spouse name: Not on file  . Number of children: Not on file  . Years of education: Not on file  . Highest education level: Not on file  Social Needs  . Financial resource strain: Not on file  . Food insecurity - worry: Not on file  . Food insecurity -  inability: Not on file  . Transportation needs - medical: Not on file  . Transportation needs - non-medical: Not on file  Occupational History  . Not on file  Tobacco Use  . Smoking status: Former Research scientist (life sciences)  . Smokeless tobacco: Current User    Types: Chew  Substance and Sexual Activity  . Alcohol use: Yes    Alcohol/week: 0.0 oz    Comment: occassionally  . Drug use: No  . Sexual activity: Not on file  Other Topics Concern  . Not on file  Social History Narrative   He is a married father of 2. He has 3 stepchildren, he has 10 grandchildren and 2 great   Grandchildren. He chews tobacco, does not   smoke. He seldom drinks alcohol.    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 He walks well over the golf course without any significant difficulties. Besides this he really does get routine exercise.      Review of Systems  All other systems reviewed and are negative.      Objective:   Physical Exam  Constitutional: He appears well-developed and well-nourished.  Cardiovascular: Normal rate, regular rhythm and normal heart sounds.  Pulmonary/Chest: Effort normal and breath sounds normal. No respiratory distress. He has no wheezes. He has no rales.  Abdominal: Soft. Bowel sounds are normal. He exhibits no distension and no mass. There is tenderness. There is no rebound and no guarding.    Genitourinary: Prostate is tender. Prostate is not enlarged.  Skin: He is not diaphoretic.  Vitals reviewed.         Assessment & Plan:  Lower abdominal pain - Plan: Urinalysis, Routine w reflex microscopic  I favor prostatitis although differential diagnosis includes colitis.  DC bactrim and switch to cipro 500 bid for 14 days and recheck in 2 weeks to ensure resolution of microscopic hematuria.  If persistent consider cystoscopy.  Send urine cx.  UA affcetd by bactrim most  likely.  If worsening, return immediately.  Reports insomnia.   I recommended increasing exercise, no computers or electronic devices before bed, regular sleep schedule (no sleepin late).  He requests ambien to use sparingly when needed.  % mg poqhs prn insomnia.  DC after 1 month.

## 2018-01-26 LAB — URINE CULTURE
MICRO NUMBER:: 90246510
RESULT: NO GROWTH
SPECIMEN QUALITY:: ADEQUATE

## 2018-02-07 ENCOUNTER — Encounter: Payer: Self-pay | Admitting: Family Medicine

## 2018-02-07 ENCOUNTER — Ambulatory Visit (INDEPENDENT_AMBULATORY_CARE_PROVIDER_SITE_OTHER): Payer: Medicare Other | Admitting: Family Medicine

## 2018-02-07 VITALS — BP 120/58 | HR 70 | Temp 97.8°F | Resp 16 | Ht 69.0 in | Wt 217.0 lb

## 2018-02-07 DIAGNOSIS — R3129 Other microscopic hematuria: Secondary | ICD-10-CM

## 2018-02-07 LAB — URINALYSIS, ROUTINE W REFLEX MICROSCOPIC
Bilirubin Urine: NEGATIVE
GLUCOSE, UA: NEGATIVE
HGB URINE DIPSTICK: NEGATIVE
Ketones, ur: NEGATIVE
LEUKOCYTES UA: NEGATIVE
NITRITE: NEGATIVE
PH: 5.5 (ref 5.0–8.0)
Protein, ur: NEGATIVE
Specific Gravity, Urine: 1.02 (ref 1.001–1.03)

## 2018-02-07 MED ORDER — DICLOFENAC SODIUM 75 MG PO TBEC: 75.0000 mg | DELAYED_RELEASE_TABLET | Freq: Two times a day (BID) | ORAL | 0 refills | Status: DC

## 2018-02-07 MED ORDER — CIPROFLOXACIN HCL 500 MG PO TABS: 500.0000 mg | ORAL_TABLET | Freq: Two times a day (BID) | ORAL | 0 refills | Status: DC

## 2018-02-07 NOTE — Progress Notes (Signed)
Subjective:    Patient ID: Brad Owen, male    DOB: 1946/08/05, 72 y.o.   MRN: [id number]  HPI 01/24/18 Patient presents with 10 days of pain and pressure directly over his bladder over his bladder.  Reports mild dysuria.  Has been on bactrim for a skin infection and it is not helping pain.  Denies frequency, urgency, but does have hesitancy and weak stream.  Prostate is tender on palpation.  Denies constipation, diarrhea, change in stool, melena or hematochezia.  Denies fever.  Has history of prostatitis and this feels similar per his report.  At that time, my plan was: I favor prostatitis although differential diagnosis includes colitis.  DC bactrim and switch to cipro 500 bid for 14 days and recheck in 2 weeks to ensure resolution of microscopic hematuria.  If persistent consider cystoscopy.  Send urine cx.  UA affcetd by bactrim most likely.  If worsening, return immediately.  Reports insomnia.   I recommended increasing exercise, no computers or electronic devices before bed, regular sleep schedule (no sleepin late).  He requests ambien to use sparingly when needed.  % mg poqhs prn insomnia.  DC after 1 month.    02/07/18 Here for follow up.  Patient states his pain went away to 3 days after starting the Cipro. He remained pain-free for more than a week on the Cipro. However he stopped the Cipro prematurely and the pain returned yesterday. He is 70% better than he was. He denies any dysuria. He denies any frequency or urgency. He denies any diarrhea or constipation. Urinalysis showed resolution of microscopic hematuria Past Medical History:  Diagnosis Date  . Asthma   . CAD S/P percutaneous coronary angioplasty 1998   BMS to OM, followed by DES in 2003: PTCA of a  side branch.; Patent stents in 2005.  [name] COPD (chronic obstructive pulmonary disease) ([place])   . Erectile dysfunction   . GERD (gastroesophageal reflux disease)   . History of stress test 01/05/2004   abnormal stress test,  positive bruce protocol exercise stress test with scintigraphic evidence of mild lateral wall ischemia at the apex. Dynamic gating reveals a preserved EF at 60% by QGS  . Hypercholesteremia   . Hypertension   . Psoriasis    Patchy psoriasis  . Psoriatic arthritis ([place])    hips   Past Surgical History:  Procedure Laterality Date  . CARDIAC CATHETERIZATION  03/1998   Normal left ventricular systolic function, Minor narrowing in the body of the stent, minor tapering in the distal RCA at the area of the PDA.  [name] CARDIAC CATHETERIZATION  02.2005   Normal left ventricular systolic function, patent stents in both the circumflex and left anterior descending with only minimal in-stent restenosis in the left anterior descending.  . CHOLECYSTECTOMY  06/05/2014  . CORONARY ANGIOPLASTY  12/06/2001   OM: cutting balloon PTCA  . CORONARY STENT PLACEMENT  06/06/1997   90% Prox LAD - 3.0 x 18 mm AVE (GFX) BMS stent   . CORONARY STENT PLACEMENT  05/10/2002   LCx: Cypher DES 3. x 18, rescue Cutting Balloon PTCA of jailed Ostial OM   . NM MYOVIEW LTD  08/2015   Normal, low risk study. EF 55-60%.   Current Outpatient Medications on File Prior to Visit  Medication Sig Dispense Refill  . Adalimumab (HUMIRA PEN) 40 MG/0.8ML PNKT Inject 40 mg as directed into the skin.     [name] albuterol (PROVENTIL HFA;VENTOLIN HFA) 108 (90 BASE) MCG/ACT inhaler Inhale 2  puffs into the lungs every 6 (six) hours as needed for wheezing or shortness of breath. 1 Inhaler 5  . aspirin EC 81 MG tablet Take 81 mg by mouth daily.    . budesonide-formoterol (SYMBICORT) 160-4.5 MCG/ACT inhaler Inhale 2 puffs into the lungs 2 (two) times daily. 1 Inhaler 3  . fluticasone (FLONASE) 50 MCG/ACT nasal spray PLACE 2 SPRAYS INTO BOTH NOSTRILS DAILY AS NEEDED FOR ALLERGIES (CONGESTION). 16 g 5  . isosorbide mononitrate (IMDUR) 30 MG 24 hr tablet TAKE 1 TABLET DAILY 30     MINUTES AFTER DAILY ASPIRINDOSE 90 tablet 3  . meloxicam (MOBIC) 15 MG tablet  Take 15 mg by mouth daily.  1  . metoprolol succinate (TOPROL-XL) 50 MG 24 hr tablet Take 1 tablet (50 mg total) by mouth daily. Take with or immediately following a meal. 90 tablet 3  . nitroGLYCERIN (NITROSTAT) 0.4 MG SL tablet Place 0.4 mg under the tongue every 5 (five) minutes as needed for chest pain.    . pantoprazole (PROTONIX) 40 MG tablet TAKE 1 TABLET DAILY 90 tablet 3  . simvastatin (ZOCOR) 20 MG tablet TAKE 1 TABLET EVERY MORNING 90 tablet 3  . zolpidem (AMBIEN) 5 MG tablet Take 1 tablet (5 mg total) by mouth at bedtime as needed for sleep. 30 tablet 1   No current facility-administered medications on file prior to visit.    No Known Allergies Social History   Socioeconomic History  . Marital status: Married    Spouse name: Not on file  . Number of children: Not on file  . Years of education: Not on file  . Highest education level: Not on file  Social Needs  . Financial resource strain: Not on file  . Food insecurity - worry: Not on file  . Food insecurity - inability: Not on file  . Transportation needs - medical: Not on file  . Transportation needs - non-medical: Not on file  Occupational History  . Not on file  Tobacco Use  . Smoking status: Former Research scientist (life sciences)  . Smokeless tobacco: Current User    Types: Chew  Substance and Sexual Activity  . Alcohol use: Yes    Alcohol/week: 0.0 oz    Comment: occassionally  . Drug use: No  . Sexual activity: Not on file  Other Topics Concern  . Not on file  Social History Narrative   He is a married father of 2. He has 3 stepchildren, he has 10 grandchildren and 2 great   Grandchildren. He chews tobacco, does not   smoke. He seldom drinks alcohol.    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 He walks well over the golf course without any significant difficulties. Besides this he really does get routine exercise.       Review of Systems  All other systems reviewed and are negative.      Objective:   Physical Exam  Constitutional: He appears well-developed and well-nourished.  Cardiovascular: Normal rate, regular rhythm and normal heart sounds.  Pulmonary/Chest: Effort normal and breath sounds normal. No respiratory distress. He has no wheezes. He has no rales.  Abdominal: Soft. Bowel sounds are normal. He exhibits no distension and no mass. There is tenderness. There is no rebound and no guarding.    Genitourinary: Prostate is tender. Prostate is not enlarged.  Skin: He is not diaphoretic.  Vitals  reviewed.         Assessment & Plan:  Microscopic hematuria - Plan: Urinalysis, Routine w reflex microscopic  Microscopic hematuria has resolved. I believe the majority of this is prostatitis which may be subacute. I will use an additional 2 weeks of Cipro 500 mg by mouth twice a day. If pain persists, I would proceed with imaging of the abdomen and pelvis to rule out other etiologies in 2 weeks. However the patient improved dramatically on antibiotics and is almost 70% better. He also complains of pain in his left shoulder worse with abduction greater than 90. He has a positive empty can sign and I suspect tendinitis versus bursitis in the shoulder. I recommended diclofenac 75 mg by mouth twice a day. If pain persists greater than 2 weeks, I would recommend a cortisone injection

## 2018-02-23 ENCOUNTER — Other Ambulatory Visit: Payer: Self-pay | Admitting: Family Medicine

## 2018-04-21 ENCOUNTER — Encounter: Payer: Self-pay | Admitting: Family Medicine

## 2018-04-21 ENCOUNTER — Ambulatory Visit (INDEPENDENT_AMBULATORY_CARE_PROVIDER_SITE_OTHER): Payer: Medicare Other | Admitting: Family Medicine

## 2018-04-21 ENCOUNTER — Other Ambulatory Visit: Payer: Self-pay

## 2018-04-21 VITALS — BP 122/56 | HR 80 | Temp 98.5°F | Resp 14 | Ht 69.0 in | Wt 210.0 lb

## 2018-04-21 DIAGNOSIS — N410 Acute prostatitis: Secondary | ICD-10-CM | POA: Diagnosis not present

## 2018-04-21 LAB — URINALYSIS, ROUTINE W REFLEX MICROSCOPIC
BILIRUBIN URINE: NEGATIVE
Glucose, UA: NEGATIVE
HGB URINE DIPSTICK: NEGATIVE
Ketones, ur: NEGATIVE
Leukocytes, UA: NEGATIVE
NITRITE: NEGATIVE
Protein, ur: NEGATIVE
SPECIFIC GRAVITY, URINE: 1.02 (ref 1.001–1.03)
pH: 6 (ref 5.0–8.0)

## 2018-04-21 MED ORDER — CIPROFLOXACIN HCL 500 MG PO TABS: 500.0000 mg | ORAL_TABLET | Freq: Two times a day (BID) | ORAL | 0 refills | Status: DC

## 2018-04-21 MED ORDER — TAMSULOSIN HCL 0.4 MG PO CAPS: 0.4000 mg | ORAL_CAPSULE | Freq: Every day | ORAL | 3 refills | Status: DC

## 2018-04-21 NOTE — Progress Notes (Signed)
Subjective:    Patient ID: Brad Owen, male    DOB: 12-25-1945, 72 y.o.   MRN: [id number]  HPI 01/24/18 Patient presents with 10 days of pain and pressure directly over his bladder over his bladder.  Reports mild dysuria.  Has been on bactrim for a skin infection and it is not helping pain.  Denies frequency, urgency, but does have hesitancy and weak stream.  Prostate is tender on palpation.  Denies constipation, diarrhea, change in stool, melena or hematochezia.  Denies fever.  Has history of prostatitis and this feels similar per his report.  At that time, my plan was: I favor prostatitis although differential diagnosis includes colitis.  DC bactrim and switch to cipro 500 bid for 14 days and recheck in 2 weeks to ensure resolution of microscopic hematuria.  If persistent consider cystoscopy.  Send urine cx.  UA affcetd by bactrim most likely.  If worsening, return immediately.  Reports insomnia.   I recommended increasing exercise, no computers or electronic devices before bed, regular sleep schedule (no sleepin late).  He requests ambien to use sparingly when needed.  % mg poqhs prn insomnia.  DC after 1 month.    02/07/18 Here for follow up.  Patient states his pain went away to 3 days after starting the Cipro. He remained pain-free for more than a week on the Cipro. However he stopped the Cipro prematurely and the pain returned yesterday. He is 70% better than he was. He denies any dysuria. He denies any frequency or urgency. He denies any diarrhea or constipation. Urinalysis showed resolution of microscopic hematuria.  At that time, my plan was: Microscopic hematuria has resolved. I believe the majority of this is prostatitis which may be subacute. I will use an additional 2 weeks of Cipro 500 mg by mouth twice a day. If pain persists, I would proceed with imaging of the abdomen and pelvis to rule out other etiologies in 2 weeks. However the patient improved dramatically on antibiotics and is  almost 70% better. He also complains of pain in his left shoulder worse with abduction greater than 90. He has a positive empty can sign and I suspect tendinitis versus bursitis in the shoulder. I recommended diclofenac 75 mg by mouth twice a day. If pain persists greater than 2 weeks, I would recommend a cortisone injection  04/21/18 Patient started to feel better after taking a week or so of the Cipro and then stop the antibiotic.  He was completely asymptomatic up until 3 or 4 days ago, when he developed pain near the prostate again.  It hurts to defecate.  It hurts and burns to pee.  He has pain up in the rectum.  On prostate exam, his prostate is swollen and tender to touch.  I am able to elicit his pain by pressing on the prostate.  However urinalysis appears completely normal Past Medical History:  Diagnosis Date  . Asthma   . CAD S/P percutaneous coronary angioplasty 1998   BMS to OM, followed by DES in 2003: PTCA of a  side branch.; Patent stents in 2005.  [name] COPD (chronic obstructive pulmonary disease) ([place])   . Erectile dysfunction   . GERD (gastroesophageal reflux disease)   . History of stress test 01/05/2004   abnormal stress test, positive bruce protocol exercise stress test with scintigraphic evidence of mild lateral wall ischemia at the apex. Dynamic gating reveals a preserved EF at 60% by QGS  . Hypercholesteremia   . Hypertension   .  Psoriasis    Patchy psoriasis  . Psoriatic arthritis ([place])    hips   Past Surgical History:  Procedure Laterality Date  . CARDIAC CATHETERIZATION  03/1998   Normal left ventricular systolic function, Minor narrowing in the body of the stent, minor tapering in the distal RCA at the area of the PDA.  [name] CARDIAC CATHETERIZATION  02.2005   Normal left ventricular systolic function, patent stents in both the circumflex and left anterior descending with only minimal in-stent restenosis in the left anterior descending.  . CHOLECYSTECTOMY  06/05/2014    . CORONARY ANGIOPLASTY  12/06/2001   OM: cutting balloon PTCA  . CORONARY STENT PLACEMENT  06/06/1997   90% Prox LAD - 3.0 x 18 mm AVE (GFX) BMS stent   . CORONARY STENT PLACEMENT  05/10/2002   LCx: Cypher DES 3. x 18, rescue Cutting Balloon PTCA of jailed Ostial OM   . NM MYOVIEW LTD  08/2015   Normal, low risk study. EF 55-60%.   Current Outpatient Medications on File Prior to Visit  Medication Sig Dispense Refill  . Adalimumab (HUMIRA PEN) 40 MG/0.8ML PNKT Inject 40 mg as directed into the skin.     [name] albuterol (PROVENTIL HFA;VENTOLIN HFA) 108 (90 BASE) MCG/ACT inhaler Inhale 2 puffs into the lungs every 6 (six) hours as needed for wheezing or shortness of breath. 1 Inhaler 5  . aspirin EC 81 MG tablet Take 81 mg by mouth daily.    . budesonide-formoterol (SYMBICORT) 160-4.5 MCG/ACT inhaler Inhale 2 puffs into the lungs 2 (two) times daily. 1 Inhaler 3  . diclofenac (VOLTAREN) 75 MG EC tablet TAKE 1 TABLET BY MOUTH TWICE A DAY 90 tablet 3  . fluticasone (FLONASE) 50 MCG/ACT nasal spray PLACE 2 SPRAYS INTO BOTH NOSTRILS DAILY AS NEEDED FOR ALLERGIES (CONGESTION). 16 g 5  . isosorbide mononitrate (IMDUR) 30 MG 24 hr tablet TAKE 1 TABLET DAILY 30     MINUTES AFTER DAILY ASPIRINDOSE 90 tablet 3  . meloxicam (MOBIC) 15 MG tablet Take 15 mg by mouth daily.  1  . metoprolol succinate (TOPROL-XL) 50 MG 24 hr tablet Take 1 tablet (50 mg total) by mouth daily. Take with or immediately following a meal. 90 tablet 3  . nitroGLYCERIN (NITROSTAT) 0.4 MG SL tablet Place 0.4 mg under the tongue every 5 (five) minutes as needed for chest pain.    . pantoprazole (PROTONIX) 40 MG tablet TAKE 1 TABLET DAILY 90 tablet 3  . simvastatin (ZOCOR) 20 MG tablet TAKE 1 TABLET EVERY MORNING 90 tablet 3  . zolpidem (AMBIEN) 5 MG tablet Take 1 tablet (5 mg total) by mouth at bedtime as needed for sleep. 30 tablet 1   No current facility-administered medications on file prior to visit.    No Known Allergies Social  History   Socioeconomic History  . Marital status: Married    Spouse name: Not on file  . Number of children: Not on file  . Years of education: Not on file  . Highest education level: Not on file  Occupational History  . Not on file  Social Needs  . Financial resource strain: Not on file  . Food insecurity:    Worry: Not on file    Inability: Not on file  . Transportation needs:    Medical: Not on file    Non-medical: Not on file  Tobacco Use  . Smoking status: Former Research scientist (life sciences)  . Smokeless tobacco: Current User    Types: Chew  Substance and  Sexual Activity  . Alcohol use: Yes    Alcohol/week: 0.0 oz    Comment: occassionally  . Drug use: No  . Sexual activity: Not on file  Lifestyle  . Physical activity:    Days per week: Not on file    Minutes per session: Not on file  . Stress: Not on file  Relationships  . Social connections:    Talks on phone: Not on file    Gets together: Not on file    Attends religious service: Not on file    Active member of club or organization: Not on file    Attends meetings of clubs or organizations: Not on file    Relationship status: Not on file  . Intimate partner violence:    Fear of current or ex partner: Not on file    Emotionally abused: Not on file    Physically abused: Not on file    Forced sexual activity: Not on file  Other Topics Concern  . Not on file  Social History Narrative   He is a married father of 2. He has 3 stepchildren, he has 10 grandchildren and 2 great   Grandchildren. He chews tobacco, does not   smoke. He seldom drinks alcohol.    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 He walks well over the golf course without any significant difficulties. Besides this he really does get routine exercise.      Review of Systems  All other systems reviewed and are negative.      Objective:   Physical  Exam  Constitutional: He appears well-developed and well-nourished.  Cardiovascular: Normal rate, regular rhythm and normal heart sounds.  Pulmonary/Chest: Effort normal and breath sounds normal. No respiratory distress. He has no wheezes. He has no rales.  Abdominal: Soft. Bowel sounds are normal. He exhibits no distension and no mass. There is tenderness. There is no rebound and no guarding.    Genitourinary: Prostate is tender. Prostate is not enlarged.  Skin: He is not diaphoretic.  Vitals reviewed.         Assessment & Plan:  Acute prostatitis Patient symptoms have recurred.  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  I recommended Cipro 500 mg p.o. twice daily for 14 days and Flomax 0.4 mg p.o. nightly.  Recheck in 2 weeks.  If symptoms persist, recommend urology consultation for chronic prostatitis

## 2018-04-21 NOTE — Addendum Note (Signed)
Addended by: Sheral Flow on: 04/21/2018 02:19 PM   Modules accepted: Orders

## 2018-04-22 LAB — URINE CULTURE
MICRO NUMBER:: 90628026
Result:: NO GROWTH
SPECIMEN QUALITY:: ADEQUATE

## 2018-05-12 ENCOUNTER — Telehealth: Payer: Self-pay | Admitting: Family Medicine

## 2018-05-12 NOTE — Telephone Encounter (Signed)
Pt called and states that his prostate is still bothering him and would like to see a urologist but will need to go to the [place] d/t cost and needs notes faxed to [facility] at [phone]. Notes faxed and pt made aware.

## 2018-05-26 ENCOUNTER — Other Ambulatory Visit: Payer: Self-pay | Admitting: Family Medicine

## 2018-06-29 DIAGNOSIS — L400 Psoriasis vulgaris: Secondary | ICD-10-CM | POA: Diagnosis not present

## 2018-06-29 DIAGNOSIS — D1801 Hemangioma of skin and subcutaneous tissue: Secondary | ICD-10-CM | POA: Diagnosis not present

## 2018-06-29 DIAGNOSIS — L409 Psoriasis, unspecified: Secondary | ICD-10-CM | POA: Diagnosis not present

## 2018-06-29 DIAGNOSIS — D229 Melanocytic nevi, unspecified: Secondary | ICD-10-CM | POA: Diagnosis not present

## 2018-06-29 DIAGNOSIS — L821 Other seborrheic keratosis: Secondary | ICD-10-CM | POA: Diagnosis not present

## 2018-07-01 ENCOUNTER — Ambulatory Visit (INDEPENDENT_AMBULATORY_CARE_PROVIDER_SITE_OTHER): Payer: Medicare Other | Admitting: Cardiology

## 2018-07-01 ENCOUNTER — Encounter: Payer: Self-pay | Admitting: Cardiology

## 2018-07-01 VITALS — BP 126/64 | HR 63 | Ht 69.0 in | Wt 210.0 lb

## 2018-07-01 DIAGNOSIS — R0609 Other forms of dyspnea: Secondary | ICD-10-CM | POA: Diagnosis not present

## 2018-07-01 DIAGNOSIS — I25118 Atherosclerotic heart disease of native coronary artery with other forms of angina pectoris: Secondary | ICD-10-CM

## 2018-07-01 DIAGNOSIS — N5201 Erectile dysfunction due to arterial insufficiency: Secondary | ICD-10-CM

## 2018-07-01 DIAGNOSIS — Z9861 Coronary angioplasty status: Secondary | ICD-10-CM

## 2018-07-01 DIAGNOSIS — E785 Hyperlipidemia, unspecified: Secondary | ICD-10-CM

## 2018-07-01 DIAGNOSIS — I10 Essential (primary) hypertension: Secondary | ICD-10-CM | POA: Diagnosis not present

## 2018-07-01 DIAGNOSIS — I2510 Atherosclerotic heart disease of native coronary artery without angina pectoris: Secondary | ICD-10-CM

## 2018-07-01 DIAGNOSIS — R0600 Dyspnea, unspecified: Secondary | ICD-10-CM

## 2018-07-01 NOTE — Patient Instructions (Addendum)
Your physician wants you to follow-up in: ONE YEAR with Dr. Ellyn Hack. You will receive a reminder letter in the mail two months in advance. If you don't receive a letter, please call our office to schedule the follow-up appointment.  OK to use Viagra but you should NOT take isosorbide mononitrate (imdur) 24 hours before or after   Dr. Ellyn Hack would like the [facility] to do blood work to check lipid panel & comprehensive metabolic panel

## 2018-07-01 NOTE — Progress Notes (Signed)
PCP: Susy Frizzle, MD  Clinic Note: Chief Complaint  Patient presents with  . Follow-up    Chronic shortness of breath, but no exertional angina  . Coronary Artery Disease  . COPD    HPI: Brad Owen is a 72 y.o. male with a PMH below who presents today for ~75-month follow-up of CAD-PCI - with a complaint of recent chest discomfort & tachycardia. [name]  He is a former patient of Dr. Aldona Bar. I first saw in July 2014   CAD history dating back to 1998 - PTCA-BMS.   last PCI was in 2003 with a Cypher DES to the OM. Repeat 2005 showed patent stents.  Last Myoview was in the fall of 2018 that was low risk with no ischemia.  Chronic DOE from COPD (former long-term smoker - quit smoking, now chews &not interested in quitting).  ARKEEM HARTS was last seen on Nov8, 2018 to discuss the results of Nuclear ST done to evaluate chest tightness & dyspnea noted during October visit. Still felt tired & fatigued, but no further CP.  Recent Hospitalizations: none  Studies Reviewed:   None   Interval History: Mert presents today for 8 month f/u (to get back on track) stating that he feels well.  He has not had any further episodes of chest pain (with the extent of exertion that he can mount) since starting on Imdur. No additional NTG use.   He says his COPD related DOE is pretty stable, but is now more limited by R hip pain (as a complication of Psoriatic Arthritis).  He denies any PND or orthopnea, but does note mild LE edema - if he is on his feet a while.  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  He says that if he walks far, he will get a little cramping in his calves - but this is rare & goes away with rest. (not limiting claudication). He denies any rapid or irregular heartbeats or rates. No syncope or near syncope, TIA or amaurosis fugax.  He has some issues with dysfunction, is not sure what he can do since he is  on Imdur.  He is willing to abstain from Imdur for couple days if necessary to be able to use Viagra..     No bleeding issues on ASA --> No melena, hematochezia, hematuria or epistaxis.  Cardiovascular ROS: positive for - dyspnea on exertion, palpitations and ED, fatigue negative for - edema, irregular heartbeat, loss of consciousness, murmur, orthopnea, palpitations, paroxysmal nocturnal dyspnea, rapid heart rate, shortness of breath or Syncope/near syncope, TIA/amaurosis fugax, claudication.   ROS: Pertinent symptoms noted in history of present illness Review of Systems  Constitutional: Negative for malaise/fatigue and weight loss.  HENT: Negative for congestion and nosebleeds.   Respiratory: Positive for shortness of breath and wheezing.        COPD  Cardiovascular:       Per history of present illness  Gastrointestinal: Negative for constipation, diarrhea and heartburn.  Genitourinary:       ED  Musculoskeletal: Positive for joint pain (Knee and hip).  Neurological: Negative for dizziness, loss of consciousness and headaches.  Endo/Heme/Allergies: Negative for environmental allergies.  Psychiatric/Behavioral: Negative for depression and memory loss. The patient has insomnia (Has a hard time going to sleep. He stays up quite a bit). The patient is not nervous/anxious.   All other systems reviewed and are negative. -  he thinks his arthritis pains are related to psoriatic arthritis   Past Medical History:  Diagnosis Date  . Asthma   . CAD S/P percutaneous coronary angioplasty 1998   BMS to OM, followed by DES in 2003: PTCA of a  side branch.; Patent stents in 2005.  [name] COPD (chronic obstructive pulmonary disease) ([place])   . Erectile dysfunction   . GERD (gastroesophageal reflux disease)   . History of stress test 01/05/2004   abnormal stress test, positive bruce protocol exercise stress test with scintigraphic evidence of mild lateral wall ischemia at the apex. Dynamic gating  reveals a preserved EF at 60% by QGS  . Hypercholesteremia   . Hypertension   . Psoriasis    Patchy psoriasis  . Psoriatic arthritis ([place])    hips    Past Surgical History:  Procedure Laterality Date  . CARDIAC CATHETERIZATION  03/1998   Normal left ventricular systolic function, Minor narrowing in the body of the stent, minor tapering in the distal RCA at the area of the PDA.  [name] CARDIAC CATHETERIZATION  02.2005   Normal left ventricular systolic function, patent stents in both the circumflex and left anterior descending with only minimal in-stent restenosis in the left anterior descending.  . CHOLECYSTECTOMY  06/05/2014  . CORONARY ANGIOPLASTY  12/06/2001   OM: cutting balloon PTCA  . CORONARY STENT PLACEMENT  06/06/1997   90% Prox LAD - 3.0 x 18 mm AVE (GFX) BMS stent   . CORONARY STENT PLACEMENT  05/10/2002   LCx: Cypher DES 3. x 18, rescue Cutting Balloon PTCA of jailed Ostial OM   . NM MYOVIEW LTD  09/16; 10/'18   a) Normal, low risk study. EF 55-60%.; b) LOW RISK . EF 57% with no RWMA.  No ischemia or infarction.   Current Meds  Medication Sig  . Adalimumab (HUMIRA PEN) 40 MG/0.8ML PNKT Inject 40 mg as directed into the skin.   [name] albuterol (PROVENTIL HFA;VENTOLIN HFA) 108 (90 BASE) MCG/ACT inhaler Inhale 2 puffs into the lungs every 6 (six) hours as needed for wheezing or shortness of breath.  [name] aspirin EC 81 MG tablet Take 81 mg by mouth daily.  . budesonide-formoterol (SYMBICORT) 160-4.5 MCG/ACT inhaler Inhale 2 puffs into the lungs 2 (two) times daily.  . fluticasone (FLONASE) 50 MCG/ACT nasal spray PLACE 2 SPRAYS INTO BOTH NOSTRILS DAILY AS NEEDED FOR ALLERGY CONGESTION  . isosorbide mononitrate (IMDUR) 30 MG 24 hr tablet TAKE 1 TABLET DAILY 30     MINUTES AFTER DAILY ASPIRINDOSE  . meloxicam (MOBIC) 15 MG tablet Take 15 mg by mouth daily.  . metoprolol succinate (TOPROL-XL) 50 MG 24 hr tablet Take 1 tablet (50 mg total) by mouth daily. Take with or immediately following a  meal.  . nitroGLYCERIN (NITROSTAT) 0.4 MG SL tablet Place 0.4 mg under the tongue every 5 (five) minutes as needed for chest pain.  . pantoprazole (PROTONIX) 40 MG tablet TAKE 1 TABLET DAILY  . simvastatin (ZOCOR) 20 MG tablet TAKE 1 TABLET EVERY MORNING  . tamsulosin (FLOMAX) 0.4 MG CAPS capsule Take 1 capsule (0.4 mg total) by mouth daily.  [name] zolpidem (AMBIEN) 5 MG tablet Take 1 tablet (5 mg total) by mouth at bedtime as needed for sleep.  . [DISCONTINUED] ciprofloxacin (CIPRO) 500 MG tablet Take 1 tablet (500 mg total) by mouth 2 (two) times daily.  . [DISCONTINUED] diclofenac (VOLTAREN) 75 MG EC tablet TAKE 1 TABLET BY MOUTH TWICE A DAY    No Known Allergies  Social History   Tobacco Use  . Smoking status: Former Research scientist (life sciences)  . Smokeless tobacco: Current User    Types: Chew  Substance Use Topics  . Alcohol use: Yes    Alcohol/week: 0.0 oz    Comment: occassionally  . Drug use: No   Social History   Social History Narrative   He is a married father of 2. He has 3 stepchildren, he has 10 grandchildren and 2 great   Grandchildren. He chews tobacco, does not   smoke. He seldom drinks alcohol.    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 He walks well over the golf course without any significant difficulties. Besides this he really does get routine exercise.     Family History  Problem Relation Age of Onset  . Heart Problems Mother   . Diabetes Father   . Heart Problems Father   . Diabetes Sister     Wt Readings from Last 3 Encounters:  07/01/18 210 lb (95.3 kg)  04/21/18 210 lb (95.3 kg)  02/07/18 217 lb (98.4 kg)    PHYSICAL EXAM BP 126/64   Pulse 63   Ht 5\' 9"  (1.753 m)   Wt 210 lb (95.3 kg)   BMI 31.01 kg/m   Physical Exam  Constitutional: He appears well-developed and well-nourished. No distress.  Healthy-appearing  HENT:  Head: Normocephalic and  atraumatic.  Eyes: EOM are normal.  Neck: Normal range of motion. Neck supple. No hepatojugular reflux and no JVD present. Carotid bruit is not present. Thyromegaly (mildly enlarged) present.  Cardiovascular: Normal rate, regular rhythm, S1 normal, S2 normal and intact distal pulses.  Occasional extrasystoles are present. PMI is not displaced. Exam reveals distant heart sounds. Exam reveals no gallop and no friction rub.  No murmur heard. Pulmonary/Chest: Effort normal. No respiratory distress.  Mild diffuse interstitial sounds, but no rales, rhonchi or wheeze.  Abdominal: Soft. Bowel sounds are normal. He exhibits no distension. There is no tenderness. There is no rebound.  Musculoskeletal: Normal range of motion. He exhibits no edema.  Neurological: He is alert.  Psychiatric: He has a normal mood and affect. His behavior is normal. Judgment and thought content normal.  Nursing note and vitals reviewed.  Adult ECG Report NSR rate 63 bpm.  Otherwise normal axis, intervals and durations.  No ST or T wave changes.    Normal EKG  Other studies Reviewed: Additional studies/ records that were reviewed today include:  Recent [facility]: [facility] - DUE FOR CHECK THIS FALL  Lab Results  Component Value Date   CHOL 125 12/11/2016   HDL 44 12/11/2016   LDLCALC 73 12/11/2016   TRIG 41 12/11/2016   CHOLHDL 2.8 12/11/2016   Lab Results  Component Value Date   CREATININE 1.14 12/11/2016   BUN 15 12/11/2016   NA 139 12/11/2016   K 4.0 12/11/2016   CL 103 12/11/2016   CO2 27 12/11/2016    ASSESSMENT / PLAN: Problem List Items Addressed This Visit    CAD S/P percutaneous coronary angioplasty (Chronic)    Distant history of bare-metal stent PCI in 2003.  Patent by cath in 2005 and 2 nonischemic Myoviews in 2016 & 2018. No further chest pain.  He is on aspirin Plavix. Statin beta-blocker.      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  Pretty much to be extended he is able to exert himself, he is not having angina with the addition of Imdur. For now continue current dose of Toprol plus Imdur.      Relevant Orders   EKG 12-Lead   DOE (dyspnea on exertion) (Chronic)   Relevant Orders   EKG 12-Lead   Erectile dysfunction (Chronic)    He has asked about using Viagra (sildenafil) again.  I do think that this is reasonable - however, he cannot use it within 24 hrs on either side of Imdur use.  [facility] -- would start @ low dose (~10-30 mg sildenafil) -- hold Imdur 24 hr before, day of & day after.      Essential hypertension (Chronic)    His blood pressure is well controlled today on current meds.  No change      Hyperlipidemia with target low density lipoprotein (LDL) cholesterol less than 70 mg/dL (Chronic)    Labs being followed by PCP in the [place].   I have requested that they draw panel in the near future for follow-up.  He is on 20mg  of Zocor - & I have not seen labs in over a year. May need to titrate up the dose if not at goal.         Current medicines are reviewed at length with the patient today. (+/- concerns) None The following changes have been made:  See below  Patient Instructions  Your physician wants you to follow-up in: ONE YEAR with Dr. Ellyn Hack. You will receive a reminder letter in the mail two months in advance. If you don't receive a letter, please call our office to schedule the follow-up appointment.  OK to use Viagra but you should NOT take isosorbide mononitrate (imdur) 24 hours before or after   Dr. Ellyn Hack would like the [facility] to do blood work to check lipid panel & comprehensive metabolic panel       Studies Ordered:   Orders Placed This Encounter  Procedures  . EKG 12-Lead      Glenetta Hew, M.D., M.S. Interventional Cardiologist   Pager # [phone] Phone # [phone] [address] [place], [place] [address]

## 2018-07-03 ENCOUNTER — Encounter: Payer: Self-pay | Admitting: Cardiology

## 2018-07-03 NOTE — Assessment & Plan Note (Signed)
His blood pressure is well controlled today on current meds.  No change

## 2018-07-03 NOTE — Assessment & Plan Note (Signed)
Labs being followed by PCP in the [place].   I have requested that they draw panel in the near future for follow-up.  He is on 20mg  of Zocor - & I have not seen labs in over a year. May need to titrate up the dose if not at goal.

## 2018-07-03 NOTE — Assessment & Plan Note (Signed)
He has asked about using Viagra (sildenafil) again.  I do think that this is reasonable - however, he cannot use it within 24 hrs on either side of Imdur use.  [facility] -- would start @ low dose (~10-30 mg sildenafil) -- hold Imdur 24 hr before, day of & day after.

## 2018-07-03 NOTE — Assessment & Plan Note (Signed)
Well-controlled stable anginal symptoms.  Pretty much to be extended he is able to exert himself, he is not having angina with the addition of Imdur. For now continue current dose of Toprol plus Imdur.

## 2018-07-03 NOTE — Assessment & Plan Note (Signed)
Distant history of bare-metal stent PCI in 2003.  Patent by cath in 2005 and 2 nonischemic Myoviews in 2016 & 2018. No further chest pain.  He is on aspirin Plavix. Statin beta-blocker.

## 2018-07-11 ENCOUNTER — Encounter: Payer: Self-pay | Admitting: Cardiology

## 2018-11-29 ENCOUNTER — Encounter: Payer: Self-pay | Admitting: Family Medicine

## 2018-11-29 DIAGNOSIS — D485 Neoplasm of uncertain behavior of skin: Secondary | ICD-10-CM | POA: Diagnosis not present

## 2018-11-29 DIAGNOSIS — L821 Other seborrheic keratosis: Secondary | ICD-10-CM | POA: Diagnosis not present

## 2018-11-29 DIAGNOSIS — Z23 Encounter for immunization: Secondary | ICD-10-CM | POA: Diagnosis not present

## 2018-11-29 DIAGNOSIS — C44629 Squamous cell carcinoma of skin of left upper limb, including shoulder: Secondary | ICD-10-CM | POA: Diagnosis not present

## 2018-11-29 DIAGNOSIS — L570 Actinic keratosis: Secondary | ICD-10-CM | POA: Diagnosis not present

## 2019-01-11 DIAGNOSIS — H353131 Nonexudative age-related macular degeneration, bilateral, early dry stage: Secondary | ICD-10-CM | POA: Diagnosis not present

## 2019-01-11 DIAGNOSIS — H524 Presbyopia: Secondary | ICD-10-CM | POA: Diagnosis not present

## 2019-01-12 DIAGNOSIS — C44629 Squamous cell carcinoma of skin of left upper limb, including shoulder: Secondary | ICD-10-CM | POA: Diagnosis not present

## 2019-01-12 DIAGNOSIS — L905 Scar conditions and fibrosis of skin: Secondary | ICD-10-CM | POA: Diagnosis not present

## 2019-01-24 HISTORY — PX: NM MYOVIEW LTD: HXRAD82

## 2019-01-26 DIAGNOSIS — C44311 Basal cell carcinoma of skin of nose: Secondary | ICD-10-CM | POA: Diagnosis not present

## 2019-01-26 DIAGNOSIS — D485 Neoplasm of uncertain behavior of skin: Secondary | ICD-10-CM | POA: Diagnosis not present

## 2019-02-17 ENCOUNTER — Other Ambulatory Visit: Payer: Self-pay

## 2019-02-17 MED ORDER — NITROGLYCERIN 0.4 MG SL SUBL: 0.4000 mg | SUBLINGUAL_TABLET | SUBLINGUAL | 3 refills | Status: DC | PRN

## 2019-05-25 ENCOUNTER — Telehealth: Payer: Self-pay | Admitting: Cardiology

## 2019-05-25 NOTE — Telephone Encounter (Signed)
Informed patient no record of  Stress scanned in  Chart. Patient states he will contact [facility] tomorrow Fax number given to patient

## 2019-05-25 NOTE — Telephone Encounter (Signed)
Patient states he had a stress test at the [facility] a few months back. He asked the office to send a copy of the results to Dr. Ellyn Hack, and he wanted to make sure you guys got those records. If you did not receive the records, please let the patient know and he will get the [place] to send over another copy

## 2019-06-15 ENCOUNTER — Ambulatory Visit: Payer: Medicare Other | Admitting: Cardiology

## 2019-06-15 ENCOUNTER — Telehealth: Payer: Self-pay | Admitting: Cardiology

## 2019-06-15 NOTE — Telephone Encounter (Signed)
LVM, reminding  Pt of his appt with Dr Ellyn Hack on 06-16-19.

## 2019-06-16 ENCOUNTER — Encounter: Payer: Self-pay | Admitting: Cardiology

## 2019-06-16 ENCOUNTER — Other Ambulatory Visit: Payer: Self-pay

## 2019-06-16 ENCOUNTER — Telehealth: Payer: Self-pay | Admitting: *Deleted

## 2019-06-16 ENCOUNTER — Telehealth (INDEPENDENT_AMBULATORY_CARE_PROVIDER_SITE_OTHER): Payer: Medicare Other | Admitting: Cardiology

## 2019-06-16 VITALS — Ht 69.0 in | Wt 212.0 lb

## 2019-06-16 DIAGNOSIS — N5201 Erectile dysfunction due to arterial insufficiency: Secondary | ICD-10-CM

## 2019-06-16 DIAGNOSIS — E785 Hyperlipidemia, unspecified: Secondary | ICD-10-CM | POA: Diagnosis not present

## 2019-06-16 DIAGNOSIS — I10 Essential (primary) hypertension: Secondary | ICD-10-CM | POA: Diagnosis not present

## 2019-06-16 DIAGNOSIS — R0600 Dyspnea, unspecified: Secondary | ICD-10-CM

## 2019-06-16 DIAGNOSIS — I25118 Atherosclerotic heart disease of native coronary artery with other forms of angina pectoris: Secondary | ICD-10-CM

## 2019-06-16 DIAGNOSIS — R0609 Other forms of dyspnea: Secondary | ICD-10-CM | POA: Diagnosis not present

## 2019-06-16 NOTE — Assessment & Plan Note (Signed)
This is really his chronic symptoms, but has now had several nonischemic Myoview stress test done with normal EF.  Certainly there could be a diastolic dysfunction component, but is probably more related to his COPD.  He is euvolemic on exam and pressures have been well controlled.  Would be reluctant to simply push afterload reduction in an attempt to improve his dyspnea.  I would be concerned that this would cause him to have orthostatic dizziness.

## 2019-06-16 NOTE — Assessment & Plan Note (Addendum)
He has a prescription for sildenafil, but has not used it.

## 2019-06-16 NOTE — Assessment & Plan Note (Signed)
I really doubt that the chest pain he was having was anginal in nature.  He said it got better when he adjusted his pantoprazole dosing.  He actually has stopped taking Imdur since doing that and has not had any more chest pain.  He has a lot of exertional dyspnea, but I think that is more related to deconditioning from his hip pain and lack of mobility along with COPD. Just had a stress test done that was reportedly negative. Continue Toprol, statin and aspirin.

## 2019-06-16 NOTE — Patient Instructions (Addendum)
Medication Instructions:   None   Lab work:  -We will see if we can get your labs from the [place] fax to [place] along with stress test results    Testing/Procedures: NOT NEEDED  Follow-Up: At [facility], you and your health needs are our priority.  As part of our continuing mission to provide you with exceptional heart care, we have created designated Provider Care Teams.  These Care Teams include your primary Cardiologist (physician) and Advanced Practice Providers (APPs -  Physician Assistants and Nurse Practitioners) who all work together to provide you with the care you need, when you need it. . You will need a follow up appointment in 6-7 months JAN/FEB 2021.  Please call our office 2 months in advance to schedule this appointment.  You may see Glenetta Hew, MD or one of the following Advanced Practice Providers on your designated Care Team:   . Rosaria Ferries, PA-C . Jory Sims, DNP, ANP  Any Other Special Instructions Will Be Listed Below (If Applicable).

## 2019-06-16 NOTE — Progress Notes (Signed)
Virtual Visit via Telephone Note   This visit type was conducted due to national recommendations for restrictions regarding the COVID-19 Pandemic (e.g. social distancing) in an effort to limit this patient's exposure and mitigate transmission in our community.  Due to his co-morbid illnesses, this patient is at least at moderate risk for complications without adequate follow up.  This format is felt to be most appropriate for this patient at this time.  The patient did not have access to video technology/had technical difficulties with video requiring transitioning to audio format only (telephone).  All issues noted in this document were discussed and addressed.  No physical exam could be performed with this format.  Please refer to the patient's chart for his  consent to telehealth for [facility].   Patient has given verbal permission to conduct this visit via virtual appointment and to bill insurance 06/16/2019 9:54 AM     Evaluation Performed:  Follow-up visit  Date:  06/16/2019   ID:  Brad Owen, DOB 04/21/1946, MRN [id number]  Patient Location: Home Provider Location: Other:  Hospital Office  PCP:  Susy Frizzle, MD -- Also being followed by Jule Ser, [place] Cardiologist:  No primary care provider on file. Glenetta Hew, MD Electrophysiologist:  None   Chief Complaint: Annual follow-up for CAD  History of Present Illness:    Brad Owen is a 73 y.o. male with PMH notable for CAD having PCI dating back to 1998 who presents via audio/video conferencing for a telehealth visit today. He is a former patient of Dr. Aldona Bar. I first saw in July 2014  CAD history dating back to 1998 - PTCA-BMS.  last PCI was in 2003 with a Cypher DES to the OM. Repeat 2005 showed patent stents. Last Myoview was in the fall of 2018 that was low risk with no ischemia. Chronic DOE from COPD (former long-term smoker - quit smoking, now chews &not interested in quitting).  BRON SNELLINGS was last  seen in Aug 2019.  He was doing fairly well.  Not necessarily noticing any significant chest pain since starting on Imdur.  COPD related exertional dyspnea stable but really limited by hip pain from his arthritis.  Minimal edema.  No real changes made -> He is now following up more with the [facility]   Interval History:  For the most part Kree is doing pretty well.  He is a somewhat limited as far as activity goes. He states that he was having some CP a few months ago -- He had a Stress Test Counselling psychologist) done @ [employer] -- but results not available (was told that it was OK).  Started taking Pantoprazole in AM & Sx improved. 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   Has a hard time walking b/c hip arthritis/bursitis.   Still gets a bit short of breath with exertion - walking, but more limited by hip pain than SOB.  Not really able to exercise.  Mild orthopnea & end of day edema -- no real PND.  Occasional spells of fast HR - lasts 10-15 sec.  No associated dizziness, SOB.   Cardiovascular ROS: positive for - dyspnea on exertion, edema and rapid heart rate negative for - chest pain, irregular heartbeat, loss of consciousness, paroxysmal nocturnal dyspnea, shortness of breath or TIA/amaurosis fugax  The patient does not have symptoms concerning for COVID-19 infection (fever, chills, cough, or new shortness of breath).  The patient  is practicing social distancing. Has a hard time wearing mask due to COPD.  ROS:  Please see the history of present illness.    Review of Systems  Constitutional: Negative for chills, fever, malaise/fatigue and weight loss.  HENT: Negative for congestion and nosebleeds.   Eyes: Negative for blurred vision.  Respiratory: Positive for cough, shortness of breath (baseline -- makes it hard to wear mask.) and wheezing.        From COPD - uses inhaler 1-2 x /week  Gastrointestinal: Positive for heartburn. Negative for  blood in stool, diarrhea, melena, nausea and vomiting.  Genitourinary: Negative for hematuria.  Musculoskeletal: Positive for joint pain (Hips - limit activity (has had occasional cortisone shots) & was supposed to start PT/OT - but cancelled due to COVID-19).  Neurological: Negative for dizziness, focal weakness, weakness and headaches.  All other systems reviewed and are negative.   Past Medical History:  Diagnosis Date  . Asthma   . CAD S/P percutaneous coronary angioplasty 1998   BMS to OM, followed by DES in 2003: PTCA of a  side branch.; Patent stents in 2005.  [name] COPD (chronic obstructive pulmonary disease) ([place])   . Erectile dysfunction   . GERD (gastroesophageal reflux disease)   . History of stress test 01/05/2004   abnormal stress test, positive bruce protocol exercise stress test with scintigraphic evidence of mild lateral wall ischemia at the apex. Dynamic gating reveals a preserved EF at 60% by QGS  . Hypercholesteremia   . Hypertension   . Psoriasis    Patchy psoriasis  . Psoriatic arthritis ([place])    hips   Past Surgical History:  Procedure Laterality Date  . CARDIAC CATHETERIZATION  03/1998   Normal left ventricular systolic function, Minor narrowing in the body of the stent, minor tapering in the distal RCA at the area of the PDA.  [name] CARDIAC CATHETERIZATION  02.2005   Normal left ventricular systolic function, patent stents in both the circumflex and left anterior descending with only minimal in-stent restenosis in the left anterior descending.  . CHOLECYSTECTOMY  06/05/2014  . CORONARY ANGIOPLASTY  12/06/2001   OM: cutting balloon PTCA  . CORONARY STENT PLACEMENT  06/06/1997   90% Prox LAD - 3.0 x 18 mm AVE (GFX) BMS stent   . CORONARY STENT PLACEMENT  05/10/2002   LCx: Cypher DES 3. x 18, rescue Cutting Balloon PTCA of jailed Ostial OM   . NM MYOVIEW LTD  09/16; 10/'18   a) Normal, low risk study. EF 55-60%.; b) LOW RISK . EF 57% with no RWMA.  No ischemia or  infarction.     Current Meds  Medication Sig  . Adalimumab (HUMIRA PEN) 40 MG/0.8ML PNKT Inject 40 mg as directed into the skin.   [name] albuterol (PROVENTIL HFA;VENTOLIN HFA) 108 (90 BASE) MCG/ACT inhaler Inhale 2 puffs into the lungs every 6 (six) hours as needed for wheezing or shortness of breath.  [name] aspirin EC 81 MG tablet Take 81 mg by mouth daily.  . budesonide-formoterol (SYMBICORT) 160-4.5 MCG/ACT inhaler Inhale 2 puffs into the lungs 2 (two) times daily.  . fluticasone (FLONASE) 50 MCG/ACT nasal spray PLACE 2 SPRAYS INTO BOTH NOSTRILS DAILY AS NEEDED FOR ALLERGY CONGESTION  . metoprolol succinate (TOPROL-XL) 50 MG 24 hr tablet Take 1 tablet (50 mg total) by mouth daily. Take with or immediately following a meal.  . nitroGLYCERIN (NITROSTAT) 0.4 MG SL tablet Place 1 tablet (0.4 mg total) under the tongue every 5 (five) minutes as  needed for chest pain.  . pantoprazole (PROTONIX) 40 MG tablet TAKE 1 TABLET DAILY  . simvastatin (ZOCOR) 20 MG tablet TAKE 1 TABLET EVERY MORNING   no longer taking IMDUR   Allergies:   Patient has no known allergies.   Social History   Tobacco Use  . Smoking status: Former Research scientist (life sciences)  . Smokeless tobacco: Current User    Types: Chew  Substance Use Topics  . Alcohol use: Yes    Alcohol/week: 0.0 standard drinks    Comment: occassionally  . Drug use: No     Family Hx: The patient's family history includes Diabetes in his father and sister; Heart Problems in his father and mother.   Prior CV studies:   The following studies were reviewed today: . ST from [place] not available.:  Labs/Other Tests and Data Reviewed:    EKG:  No ECG reviewed.  Recent Labs: No results found for requested labs within last 8760 hours.   Recent Lipid Panel  [facility] months [facility] @ [place]. Lab Results  Component Value Date/Time   CHOL 125 12/11/2016 09:57 AM   TRIG 41 12/11/2016 09:57 AM   HDL 44 12/11/2016 09:57 AM   CHOLHDL 2.8 12/11/2016 09:57 AM    LDLCALC 73 12/11/2016 09:57 AM    Wt Readings from Last 3 Encounters:  06/16/19 212 lb (96.2 kg)  07/01/18 210 lb (95.3 kg)  04/21/18 210 lb (95.3 kg)     Objective:    Vital Signs:  Ht 5\' 9"  (1.753 m)   Wt 212 lb (96.2 kg)   BMI 31.31 kg/m   VITAL SIGNS:  reviewed GEN:  no acute distress RESPIRATORY:  normal respiratory effort, symmetric expansion CARDIOVASCULAR:  no peripheral edema NEURO:  alert and oriented x 3, no obvious focal deficit PSYCH:  normal affect  ASSESSMENT & PLAN:    Problem List Items Addressed This Visit    Hyperlipidemia with target low density lipoprotein (LDL) cholesterol less than 70 mg/dL (Chronic)    His labs are now again being followed by the [place].  Back in 2018 cholesterol levels look pretty good.  He is only on 20 mg of simvastatin.  Would like to see the recent labs that he just had done by the [place].  We will try to get back the results faxed to [place].      Essential hypertension (Chronic)    Historically, his blood pressures been well controlled with Toprol alone.  I do not have blood pressures from today, but he says during [facility], his pressures look good.    For now, we will simply continue current dose of medicine.      Erectile dysfunction (Chronic)    He has a prescription for sildenafil, but has not used it.      DOE (dyspnea on exertion) (Chronic)    This is really his chronic symptoms, but has now had several nonischemic Myoview stress test done with normal EF.  Certainly there could be a diastolic dysfunction component, but is probably more related to his COPD.  He is euvolemic on exam and pressures have been well controlled.  Would be reluctant to simply push afterload reduction in an attempt to improve his dyspnea.  I would be concerned that this would cause him to have orthostatic dizziness.      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  He said it got better when he adjusted his pantoprazole dosing.  He actually has stopped taking Imdur since doing that and has not had any more chest pain.  He has a lot of exertional dyspnea, but I think that is more related to deconditioning from his hip pain and lack of mobility along with COPD. Just had a stress test done that was reportedly negative. Continue Toprol, statin and aspirin.         COVID-19 Education: The signs and symptoms of COVID-19 were discussed with the patient and how to seek care for testing (follow up with PCP or arrange E-visit).   The importance of social distancing was discussed today.  Time:   Today, I have spent 22 minutes with the patient with telehealth technology discussing the above problems.     Medication Adjustments/Labs and Tests Ordered: Current medicines are reviewed at length with the patient today.  Concerns regarding medicines are outlined above.  Medication Instructions:  No new changes  Tests Ordered: No orders of the defined types were placed in this encounter.  No labs ordered: Would be nice if we can get your lab results and the stress test with results from the [place] faxed over to our office.  Medication Changes: No orders of the defined types were placed in this encounter.   Disposition:  Follow up in 6-7 month(s)    Signed, Glenetta Hew, MD  06/16/2019 9:54 AM    [facility]

## 2019-06-16 NOTE — Assessment & Plan Note (Signed)
Historically, his blood pressures been well controlled with Toprol alone.  I do not have blood pressures from today, but he says [facility], his pressures look good.    For now, we will simply continue current dose of medicine.

## 2019-06-16 NOTE — Assessment & Plan Note (Signed)
His labs are now again being followed by the [place].  Back in 2018 cholesterol levels look pretty good.  He is only on 20 mg of simvastatin.  Would like to see the recent labs that he just had done by the [place].  We will try to get back the results faxed to [place].

## 2019-06-16 NOTE — Telephone Encounter (Signed)
Called and informed patient - received information from the [place]

## 2019-06-27 NOTE — Telephone Encounter (Signed)
CALLED LEFT MESSAGE THAT DR HARDING HAS REVIEWED  INFORMATION BUT IT WAS NOT COMPLETE DR HARDING HAS REQUESTED THE RADIOLOGY  REPORT OF STRESS TEST.  [facility]ED THE [facility] AND  LEFT MESSAGE FOR NURSE TO SEND COPY OF RADIOLOGY REPORT.   RN LEFT MESSAGE  FOR PATIENT AS WELL

## 2019-07-06 NOTE — Telephone Encounter (Signed)
RECEIVED  INFORMATION FORM [facility]- RADIOLGY  REPORT OF STRESS TEST

## 2019-07-20 ENCOUNTER — Encounter: Payer: Self-pay | Admitting: Cardiology

## 2020-01-10 ENCOUNTER — Ambulatory Visit (INDEPENDENT_AMBULATORY_CARE_PROVIDER_SITE_OTHER): Payer: Medicare PPO | Admitting: Nurse Practitioner

## 2020-01-10 ENCOUNTER — Other Ambulatory Visit: Payer: Self-pay

## 2020-01-10 DIAGNOSIS — K529 Noninfective gastroenteritis and colitis, unspecified: Secondary | ICD-10-CM

## 2020-01-10 NOTE — Progress Notes (Signed)
Virtual Visit via Telephone Note  I connected Brad Owen a 74 y.o caucasian male on 01/10/2020 at 11:06 AM by telephoneand verified that I am speaking with the correct person using two identifiers.  Pt location: at home   Physician location:  In office, [place], Ishmael Holter, FNP-C    On call: patient and physician   I discussed the limitations, risks, security and privacy concerns of performing an evaluation and management service by telephone and the availability of in person appointments. I also discussed with the patient that there may be a patient responsible charge related to this service. The patient expressed understanding and agreed to proceed.   History of Present Illness: Pt called for symptoms that started Tuesday at [place]. His symptoms included vomiting and diarrhea that continued several times until 8pm Wednesday night. He is drinking water as tolerated, has tried no other treatments. Pt denied h/o kidney, liver, or other GI diseases. Denies any fever/ chills/ bodyaches/ pain, abd. pain, sob, cp/ct, GU sxs, cough, lost of smell or taste or nasal congestion or sinus pressure. He denied other know sick contacts or others close to him with similar sxs. He does report eating a footlong chicken teriyaki sub from [employer] the night before when he did start feeling nasudeated with in a few hours after eating.   He reports his sxs has nearly resolved today but his wife is concerned that his home scale last week was 202lbs and today 187lbs. The pt is tolerating his water without having diarrhea or vomiting other than diarrhea once or twice today. Time spent discussing importance of hydration and seeking medical attention for non resolving, worsening, or new symptoms of other conditions and/or dehydration.   Observations/Objective: NAD noted, able to speak in full sentences but very congested sounding, harsh cough, no wheeze heard   Assessment and Plan: Your  symptoms are consistent with gastroenteritis most likely viral or from food intolerance.   Follow a clear liquid diet today such as Clear broth, Tea, Cranberry juice, Jell-O, Popsicles, Gatorade. Drink about 56mls every 30 minutes while awake or as tolerated.   If tolerated tomorrow start Strained creamy soups, Tea, Juice, Jell-O, Milkshakes, Pudding, Popsicles, Gatorade every 30 minute at least 2mls while awake or as tolerated.   If you have sxs with intake contact office immediately, if cannot reach office for apt then seek medical attention as you are at risk for dehydration.  Signs and symptoms of dehydration are feeling thirsty, dark yellow and strong-smelling pee, feeling dizzy or lightheaded, feeling tired, a dry mouth, lips and eyes, peeing little, and fewer than 4 times a day.  Your goals are to:  Call for professional help when needed Replace lost fluids and nutrients Consider diarrhea medicines Avoid offering certain foods while there is diarrhea Do what you can to increase comfort Seek urgent medical attention for non resolving, worsening, and/or new symptoms.   Follow Up Instructions:  I discussed the assessment and treatment plan with the patient. The patient was provided an opportunity to ask questions and all were answered. The patient agreed with the plan and demonstrated an understanding of the instructions.  The patient was advised to call back or seek an in-person evaluation if the symptoms worsen or if the condition fails to improve as anticipated.  I provided 16 minutes of non-face-to-face time during this encounter. End Time 11:22AM   Ishmael Holter, FNP-C

## 2020-01-10 NOTE — Patient Instructions (Signed)
Follow a clear liquid diet today such as Clear broth,  Tea, Cranberry juice, Jell-O, Popsicles, Gatorade. Drink about 71mls every 30 minutes while awake or as tolerated.   If tolerated tomorrow start Strained creamy soups Tea, Juice, Jell-O, Milkshakes, Pudding, Popsicles, Gatorade every 30 minute at least 62mls while awake or as tolerated.   If you have sxs with intake contact office immediately, if cannot reach office for apt then seek medical attention as you are at risk for dehydration.  Signs and symptoms of dehydration are feeling thirsty, dark yellow and strong-smelling pee, feeling dizzy or lightheaded, feeling tired, a dry mouth, lips and eyes, peeing little, and fewer than 4 times a day.  Your goals are to:  Call for professional help when needed Replace lost fluids and nutrients Consider diarrhea medicines Avoid offering certain foods while there is diarrhea Do what you can to increase comfort Seek urgent medical attention for non resolving, worsening, and/or new symptoms.

## 2020-01-11 ENCOUNTER — Telehealth: Payer: Self-pay | Admitting: Family Medicine

## 2020-01-11 NOTE — Telephone Encounter (Signed)
Patient spoke with Brad Owen yesterday, and told mr Feld if he was still sick today to call and she would prescribe him something  [phone]

## 2020-01-15 NOTE — Telephone Encounter (Signed)
Give him a call to see how he is doing. Is he able to take in foods and esp. Fluids without diarrhea. Does he need an appointment today?

## 2020-01-15 NOTE — Telephone Encounter (Signed)
Spoke with pt and he states that he is feeling much better. He ate a whole meal yesterday and today with no issues. The diarrhea has subsided.

## 2020-02-06 ENCOUNTER — Telehealth (INDEPENDENT_AMBULATORY_CARE_PROVIDER_SITE_OTHER): Payer: Medicare PPO | Admitting: Physician Assistant

## 2020-02-06 ENCOUNTER — Encounter: Payer: Self-pay | Admitting: Physician Assistant

## 2020-02-06 VITALS — BP 122/72 | HR 74 | Ht 69.0 in | Wt 194.0 lb

## 2020-02-06 DIAGNOSIS — I2510 Atherosclerotic heart disease of native coronary artery without angina pectoris: Secondary | ICD-10-CM | POA: Diagnosis not present

## 2020-02-06 DIAGNOSIS — E785 Hyperlipidemia, unspecified: Secondary | ICD-10-CM | POA: Diagnosis not present

## 2020-02-06 DIAGNOSIS — I10 Essential (primary) hypertension: Secondary | ICD-10-CM

## 2020-02-06 MED ORDER — PANTOPRAZOLE SODIUM 40 MG PO TBEC: 40.0000 mg | DELAYED_RELEASE_TABLET | Freq: Every day | ORAL | 1 refills | Status: AC

## 2020-02-06 MED ORDER — METOPROLOL SUCCINATE ER 50 MG PO TB24: 50.0000 mg | ORAL_TABLET | Freq: Every day | ORAL | 1 refills | Status: DC

## 2020-02-06 MED ORDER — SIMVASTATIN 20 MG PO TABS: 20.0000 mg | ORAL_TABLET | ORAL | 1 refills | Status: AC

## 2020-02-06 MED ORDER — NITROGLYCERIN 0.4 MG SL SUBL: 0.4000 mg | SUBLINGUAL_TABLET | SUBLINGUAL | 3 refills | Status: DC | PRN

## 2020-02-06 NOTE — Progress Notes (Signed)
Virtual Visit via Telephone Note   This visit type was conducted due to national recommendations for restrictions regarding the COVID-19 Pandemic (e.g. social distancing) in an effort to limit this patient's exposure and mitigate transmission in our community.  Due to his co-morbid illnesses, this patient is at least at moderate risk for complications without adequate follow up.  This format is felt to be most appropriate for this patient at this time.  The patient did not have access to video technology/had technical difficulties with video requiring transitioning to audio format only (telephone).  All issues noted in this document were discussed and addressed.  No physical exam could be performed with this format.  Please refer to the patient's chart for his  consent to telehealth for [facility].  Evaluation Performed:  Follow-up visit  This visit type was conducted due to national recommendations for restrictions regarding the COVID-19 Pandemic (e.g. social distancing).  This format is felt to be most appropriate for this patient at this time.  All issues noted in this document were discussed and addressed.  No physical exam was performed (except for noted visual exam findings with Video Visits).  Please refer to the patient's chart (MyChart message for video visits and phone note for telephone visits) for the patient's consent to telehealth for [facility].  Date:  02/06/2020   ID:  Brad Owen, DOB October 17, 1946, MRN [id number]  Patient Location:  Home  Provider location:   Office  PCP:  Susy Frizzle, MD and [place]  Cardiologist:  Glenetta Hew, MD 06/16/2019 Electrophysiologist:  None   Chief Complaint:  Cardiology follow up  History of Present Illness:    Brad Owen is a 74 y.o. male who presents via audio/video conferencing for a telehealth visit today.    74 y.o. yo male who has a hx of BMS OM 1998, DES LAD 2003 w/ PTCA side branch (patent 2005), HTN,  HLD, GERD, COPD, psoriatic arthritis  He gets most of his care at the [place]. They have a group of MDs that follow each patient.   He was having some chest pain in the mornings, with exertion. However, he started taking his Protonix in the mornings and the chest pain greatly improved.   At one point, he was on Imdur 30 mg. He quit taking it because of headaches.   He can't walk very far because of bursitis in his hips, and his breathing. He got shots in his hips that helped the bursitis. However, he still cannot walk that far. He is some limited by his breathing, but also by his joints.  He does not hear himself wheeze very much.  His dyspnea on exertion is significant, but at baseline.  He is not on home O2.  He does not wake with lower extremity edema.  He does not have orthopnea or PND.  They still have 1 horse, but he does not have to muck the stall.   He is going back to work next month at [employer]. The work is not strenuous, he limits what he does.   They are keeping their 32 month old great-granddaughter, his wife is doing most of that.   He never gets chest pain.  He does not get palpitations.  He denies presyncope or syncope.  The patient does not have symptoms concerning for COVID-19 infection (fever, chills, cough, or new shortness of breath).    Prior CV studies:   The  following studies were reviewed today:  [employer] nuc stress test: 01/24/2019 No defects at rest or w/ stress No scar or ischemia, EF 67%  CARDIAC CATH: 01/22/2004 EF >55% L main ok LAD: prox stent w/ minimal ISR, D1 no dz CFX: OM2 & CFX < 20% stenosis, OM1 w/ prox stent>patent RCA: small but dominant, no dz   Past Medical History:  Diagnosis Date  . Asthma   . CAD S/P percutaneous coronary angioplasty 1998   BMS to OM, followed by DES LAD in 2003: PTCA of a  side branch.; Patent stents in 2005.  [name] COPD (chronic obstructive pulmonary disease) ([place])   . Erectile dysfunction   . GERD  (gastroesophageal reflux disease)   . History of stress test 01/05/2004   abnormal stress test, positive bruce protocol exercise stress test with scintigraphic evidence of mild lateral wall ischemia at the apex. Dynamic gating reveals a preserved EF at 60% by QGS  . Hypercholesteremia   . Hypertension   . Psoriasis    Patchy psoriasis  . Psoriatic arthritis ([place])    hips   Past Surgical History:  Procedure Laterality Date  . CARDIAC CATHETERIZATION  03/1998   Normal left ventricular systolic function, Minor narrowing in the body of the stent, minor tapering in the distal RCA at the area of the PDA.  [name] CARDIAC CATHETERIZATION  02.2005   Normal left ventricular systolic function, patent stents in both the circumflex and left anterior descending with only minimal in-stent restenosis in the left anterior descending.  . CHOLECYSTECTOMY  06/05/2014  . CORONARY ANGIOPLASTY  12/06/2001   OM: cutting balloon PTCA  . CORONARY STENT PLACEMENT  06/06/1997   90% Prox LAD - 3.0 x 18 mm AVE (GFX) BMS stent   . CORONARY STENT PLACEMENT  05/10/2002   LCx: Cypher DES 3. x 18, rescue Cutting Balloon PTCA of jailed Ostial OM   . NM MYOVIEW LTD  09/16; 10/'18   a) Normal, low risk study. EF 55-60%.; b) LOW RISK . EF 57% with no RWMA.  No ischemia or infarction.  [name] NM MYOVIEW LTD  01/24/2019   [facility]: EF 67%.  No reversible ischemia.  No infarct.     Current Meds  Medication Sig  . Adalimumab (HUMIRA PEN) 40 MG/0.8ML PNKT Inject 40 mg as directed into the skin.   [name] albuterol (PROVENTIL HFA;VENTOLIN HFA) 108 (90 BASE) MCG/ACT inhaler Inhale 2 puffs into the lungs every 6 (six) hours as needed for wheezing or shortness of breath.  [name] aspirin EC 81 MG tablet Take 81 mg by mouth daily.  . budesonide-formoterol (SYMBICORT) 160-4.5 MCG/ACT inhaler Inhale 2 puffs into the lungs 2 (two) times daily.  . fluticasone (FLONASE) 50 MCG/ACT nasal spray PLACE 2 SPRAYS INTO BOTH NOSTRILS DAILY AS NEEDED FOR ALLERGY  CONGESTION  . metoprolol succinate (TOPROL-XL) 50 MG 24 hr tablet Take 1 tablet (50 mg total) by mouth daily. Take with or immediately following a meal.  . nitroGLYCERIN (NITROSTAT) 0.4 MG SL tablet Place 1 tablet (0.4 mg total) under the tongue every 5 (five) minutes as needed for chest pain.  . pantoprazole (PROTONIX) 40 MG tablet TAKE 1 TABLET DAILY  . simvastatin (ZOCOR) 20 MG tablet TAKE 1 TABLET EVERY MORNING     Allergies:   Patient has no known allergies.   Social History   Tobacco Use  . Smoking status: Former Research scientist (life sciences)  . Smokeless tobacco: Current User    Types: Chew  Substance Use Topics  . Alcohol  use: Yes    Alcohol/week: 0.0 standard drinks    Comment: occassionally  . Drug use: No     Family Hx: The patient's family history includes Diabetes in his father and sister; Heart Problems in his father and mother.  ROS:   Please see the history of present illness.    All other systems reviewed and are negative.   Labs/Other Tests and Data Reviewed:    Recent Labs: No results found for requested labs within last 8760 hours.   CBC    Component Value Date/Time   WBC 8.6 12/11/2016 0957   RBC 4.34 12/11/2016 0957   HGB 12.9 (L) 12/11/2016 0957   HCT 39.6 12/11/2016 0957   PLT 186 12/11/2016 0957   MCV 91.2 12/11/2016 0957   MCH 29.7 12/11/2016 0957   MCHC 32.6 12/11/2016 0957   RDW 12.9 12/11/2016 0957   LYMPHSABS 3,354 12/11/2016 0957   MONOABS 1,032 (H) 12/11/2016 0957   EOSABS 860 (H) 12/11/2016 0957   BASOSABS 86 12/11/2016 0957    CMP Latest Ref Rng & Units 12/11/2016 07/17/2016 08/20/2015  Glucose 70 - 99 mg/dL 109(H) 125(H) 100(H)  BUN 7 - 25 mg/dL 15 15 15   Creatinine 0.70 - 1.25 mg/dL 1.14 1.04 0.98  Sodium 135 - 146 mmol/L 139 139 137  Potassium 3.5 - 5.3 mmol/L 4.0 3.8 3.9  Chloride 98 - 110 mmol/L 103 105 101  CO2 20 - 31 mmol/L 27 25 30   Calcium 8.6 - 10.3 mg/dL 9.1 8.5(L) 9.2  Total Protein 6.1 - 8.1 g/dL 7.3 - 7.2  Total Bilirubin 0.2 - 1.2  mg/dL 0.4 - 1.3(H)  Alkaline Phos 40 - 115 U/L 71 - 73  AST 10 - 35 U/L 24 - 19  ALT 9 - 46 U/L 33 - 22     Recent Lipid Panel Lab Results  Component Value Date/Time   CHOL 125 12/11/2016 09:57 AM   TRIG 41 12/11/2016 09:57 AM   HDL 44 12/11/2016 09:57 AM   CHOLHDL 2.8 12/11/2016 09:57 AM   LDLCALC 73 12/11/2016 09:57 AM    Wt Readings from Last 3 Encounters:  02/06/20 194 lb (88 kg)  06/16/19 212 lb (96.2 kg)  07/01/18 210 lb (95.3 kg)     Objective:    Vital Signs:  BP 122/72   Pulse 74   Ht 5\' 9"  (1.753 m)   Wt 194 lb (88 kg)   BMI 28.65 kg/m    74 y.o. male in no acute distress over the phone.   ASSESSMENT & PLAN:    1.  CAD: -He is to continue aspirin, beta-blocker, statin and sublingual nitroglycerin. -At this time, he is not having any symptoms concerning for ischemia, so no additional testing is needed -[facility] that was negative for scar or ischemia, EF 57% -At this time, he is not having any concerning symptoms. -If he develops mild stable angina, can try adding Imdur 15 mg a day or try Ranexa.  He previously did not tolerate Imdur at 30 mg a day because of headaches -He understands that if he develops symptoms reminiscent of his pre--PCI symptoms, he is to take nitroglycerin and contact 911  2. HTN: -His blood pressures well controlled on current medications. -Continue Toprol-XL  3.  Hyperlipidemia -Continue statin -[facility] -We need the most updated records, but his LDL was 56 12/2018   COVID-19 Education: The signs and symptoms of  COVID-19 were discussed with the patient and how to seek care for testing (follow up with PCP or arrange E-visit).  The importance of social distancing was discussed today.  Patient Risk:   After full review of this patient's clinical status, I feel that they are at least moderate risk at this time.  Time:   Today, I have spent 17 minutes with the patient with  telehealth technology discussing cardiac and other health issues.     Medication Adjustments/Labs and Tests Ordered: Current medicines are reviewed at length with the patient today.  Concerns regarding medicines are outlined above.  Tests Ordered: No orders of the defined types were placed in this encounter.  Medication Changes: No orders of the defined types were placed in this encounter.   Disposition:  Follow up with Glenetta Hew, MD   Signed, Rosaria Ferries, PA-C  02/06/2020 10:31 AM    [place]

## 2020-02-06 NOTE — Patient Instructions (Signed)
Medication Instructions:  Your physician recommends that you continue on your current medications as directed. Please refer to the Current Medication list given to you today.  *If you need a refill on your cardiac medications before your next appointment, please call your pharmacy*   Lab Work: Please have the [place] fax lab work to our office at [phone] ATTN: Dr. Ellyn Hack.  If you have labs (blood work) drawn today and your tests are completely normal, you will receive your results only by: [name] MyChart Message (if you have MyChart) OR . A paper copy in the mail If you have any lab test that is abnormal or we need to change your treatment, we will call you to review the results.   Follow-Up: At [facility], you and your health needs are our priority.  As part of our continuing mission to provide you with exceptional heart care, we have created designated Provider Care Teams.  These Care Teams include your primary Cardiologist (physician) and Advanced Practice Providers (APPs -  Physician Assistants and Nurse Practitioners) who all work together to provide you with the care you need, when you need it.  We recommend signing up for the patient portal called "MyChart".  Sign up information is provided on this After Visit Summary.  MyChart is used to connect with patients for Virtual Visits (Telemedicine).  Patients are able to view lab/test results, encounter notes, upcoming appointments, etc.  Non-urgent messages can be sent to your provider as well.   To learn more about what you can do with MyChart, go to [email or web address].    Your next appointment:   6 month(s)  The format for your next appointment:   In Person  Provider:   Glenetta Hew, MD   Other Instructions Please call our office 2 months in advance to schedule your follow-up appointment.

## 2021-03-20 ENCOUNTER — Telehealth: Payer: Self-pay | Admitting: Family Medicine

## 2021-03-20 NOTE — Telephone Encounter (Signed)
Copied from [place] [phone]. Topic: Medicare AWV >> Mar 20, 2021 11:57 AM Cher Nakai R wrote: Reason for CRM:  Left message for patient to call back and schedule Medicare Annual Wellness Visit (AWV) in office.   If not able to come in office, please offer to do virtually or by telephone.   Last AWV:  12/11/2016   Please schedule at anytime with [facility]-Nurse Health Advisor.  If any questions, please contact me at [phone]

## 2021-12-26 ENCOUNTER — Ambulatory Visit: Payer: Medicare PPO | Admitting: Family Medicine

## 2022-09-04 ENCOUNTER — Other Ambulatory Visit: Payer: Self-pay | Admitting: Internal Medicine

## 2022-09-04 DIAGNOSIS — K769 Liver disease, unspecified: Secondary | ICD-10-CM

## 2022-09-18 ENCOUNTER — Other Ambulatory Visit: Payer: Self-pay

## 2022-09-18 ENCOUNTER — Emergency Department (HOSPITAL_COMMUNITY): Payer: No Typology Code available for payment source

## 2022-09-18 ENCOUNTER — Encounter (HOSPITAL_COMMUNITY): Payer: Self-pay | Admitting: Emergency Medicine

## 2022-09-18 ENCOUNTER — Observation Stay (HOSPITAL_COMMUNITY)
Admission: EM | Admit: 2022-09-18 | Discharge: 2022-09-19 | Disposition: A | Discharge status: Home / Self Care | Payer: No Typology Code available for payment source | Attending: Cardiology | Admitting: Cardiology

## 2022-09-18 DIAGNOSIS — Z7982 Long term (current) use of aspirin: Secondary | ICD-10-CM | POA: Insufficient documentation

## 2022-09-18 DIAGNOSIS — J45909 Unspecified asthma, uncomplicated: Secondary | ICD-10-CM | POA: Diagnosis not present

## 2022-09-18 DIAGNOSIS — Z79899 Other long term (current) drug therapy: Secondary | ICD-10-CM | POA: Diagnosis not present

## 2022-09-18 DIAGNOSIS — N183 Chronic kidney disease, stage 3 unspecified: Secondary | ICD-10-CM

## 2022-09-18 DIAGNOSIS — Z794 Long term (current) use of insulin: Secondary | ICD-10-CM | POA: Diagnosis not present

## 2022-09-18 DIAGNOSIS — R7989 Other specified abnormal findings of blood chemistry: Secondary | ICD-10-CM | POA: Diagnosis not present

## 2022-09-18 DIAGNOSIS — I2489 Other forms of acute ischemic heart disease: Secondary | ICD-10-CM

## 2022-09-18 DIAGNOSIS — R0789 Other chest pain: Secondary | ICD-10-CM | POA: Insufficient documentation

## 2022-09-18 DIAGNOSIS — R778 Other specified abnormalities of plasma proteins: Secondary | ICD-10-CM | POA: Diagnosis not present

## 2022-09-18 DIAGNOSIS — I10 Essential (primary) hypertension: Secondary | ICD-10-CM | POA: Diagnosis present

## 2022-09-18 DIAGNOSIS — I25118 Atherosclerotic heart disease of native coronary artery with other forms of angina pectoris: Secondary | ICD-10-CM | POA: Diagnosis not present

## 2022-09-18 DIAGNOSIS — J449 Chronic obstructive pulmonary disease, unspecified: Secondary | ICD-10-CM | POA: Insufficient documentation

## 2022-09-18 DIAGNOSIS — Z955 Presence of coronary angioplasty implant and graft: Secondary | ICD-10-CM | POA: Diagnosis not present

## 2022-09-18 DIAGNOSIS — I480 Paroxysmal atrial fibrillation: Principal | ICD-10-CM | POA: Insufficient documentation

## 2022-09-18 DIAGNOSIS — R946 Abnormal results of thyroid function studies: Secondary | ICD-10-CM | POA: Diagnosis not present

## 2022-09-18 DIAGNOSIS — I2510 Atherosclerotic heart disease of native coronary artery without angina pectoris: Secondary | ICD-10-CM | POA: Diagnosis not present

## 2022-09-18 DIAGNOSIS — R0602 Shortness of breath: Secondary | ICD-10-CM | POA: Insufficient documentation

## 2022-09-18 DIAGNOSIS — I129 Hypertensive chronic kidney disease with stage 1 through stage 4 chronic kidney disease, or unspecified chronic kidney disease: Secondary | ICD-10-CM | POA: Insufficient documentation

## 2022-09-18 DIAGNOSIS — Z87891 Personal history of nicotine dependence: Secondary | ICD-10-CM | POA: Diagnosis not present

## 2022-09-18 DIAGNOSIS — E785 Hyperlipidemia, unspecified: Secondary | ICD-10-CM | POA: Diagnosis present

## 2022-09-18 DIAGNOSIS — I4891 Unspecified atrial fibrillation: Secondary | ICD-10-CM | POA: Diagnosis present

## 2022-09-18 DIAGNOSIS — R531 Weakness: Secondary | ICD-10-CM | POA: Diagnosis present

## 2022-09-18 LAB — BRAIN NATRIURETIC PEPTIDE: B Natriuretic Peptide: 60 pg/mL (ref 0.0–100.0)

## 2022-09-18 LAB — HEPARIN LEVEL (UNFRACTIONATED): Heparin Unfractionated: 0.37 IU/mL (ref 0.30–0.70)

## 2022-09-18 LAB — COMPREHENSIVE METABOLIC PANEL
ALT: 19 U/L (ref 0–44)
AST: 18 U/L (ref 15–41)
Albumin: 4 g/dL (ref 3.5–5.0)
Alkaline Phosphatase: 71 U/L (ref 38–126)
Anion gap: 9 (ref 5–15)
BUN: 15 mg/dL (ref 8–23)
CO2: 27 mmol/L (ref 22–32)
Calcium: 9.7 mg/dL (ref 8.9–10.3)
Chloride: 107 mmol/L (ref 98–111)
Creatinine, Ser: 1.42 mg/dL — ABNORMAL HIGH (ref 0.61–1.24)
GFR, Estimated: 52 mL/min — ABNORMAL LOW (ref >60)
Glucose, Bld: 129 mg/dL — ABNORMAL HIGH (ref 70–99)
Potassium: 3.5 mmol/L (ref 3.5–5.1)
Sodium: 143 mmol/L (ref 135–145)
Total Bilirubin: 1.2 mg/dL (ref 0.3–1.2)
Total Protein: 8.1 g/dL (ref 6.5–8.1)

## 2022-09-18 LAB — URINALYSIS, ROUTINE W REFLEX MICROSCOPIC
Bilirubin Urine: NEGATIVE
Glucose, UA: NEGATIVE mg/dL
Hgb urine dipstick: NEGATIVE
Ketones, ur: NEGATIVE mg/dL
Leukocytes,Ua: NEGATIVE
Nitrite: NEGATIVE
Protein, ur: NEGATIVE mg/dL
Specific Gravity, Urine: 1.005 (ref 1.005–1.030)
pH: 7 (ref 5.0–8.0)

## 2022-09-18 LAB — CBC WITH DIFFERENTIAL/PLATELET
Abs Immature Granulocytes: 0.03 10*3/uL (ref 0.00–0.07)
Basophils Absolute: 0.1 10*3/uL (ref 0.0–0.1)
Basophils Relative: 1 %
Eosinophils Absolute: 0.7 10*3/uL — ABNORMAL HIGH (ref 0.0–0.5)
Eosinophils Relative: 6 %
HCT: 41 % (ref 39.0–52.0)
Hemoglobin: 13.5 g/dL (ref 13.0–17.0)
Immature Granulocytes: 0 %
Lymphocytes Relative: 33 %
Lymphs Abs: 3.6 10*3/uL (ref 0.7–4.0)
MCH: 30 pg (ref 26.0–34.0)
MCHC: 32.9 g/dL (ref 30.0–36.0)
MCV: 91.1 fL (ref 80.0–100.0)
Monocytes Absolute: 1.1 10*3/uL — ABNORMAL HIGH (ref 0.1–1.0)
Monocytes Relative: 10 %
Neutro Abs: 5.7 10*3/uL (ref 1.7–7.7)
Neutrophils Relative %: 50 %
Platelets: 205 10*3/uL (ref 150–400)
RBC: 4.5 MIL/uL (ref 4.22–5.81)
RDW: 12.9 % (ref 11.5–15.5)
WBC: 11.2 10*3/uL — ABNORMAL HIGH (ref 4.0–10.5)
nRBC: 0 % (ref 0.0–0.2)

## 2022-09-18 LAB — TSH: TSH: 6.216 u[IU]/mL — ABNORMAL HIGH (ref 0.350–4.500)

## 2022-09-18 LAB — MAGNESIUM: Magnesium: 2 mg/dL (ref 1.7–2.4)

## 2022-09-18 LAB — TROPONIN I (HIGH SENSITIVITY)
Troponin I (High Sensitivity): 77 ng/L — ABNORMAL HIGH (ref <18)
Troponin I (High Sensitivity): 172 ng/L (ref <18)

## 2022-09-18 MED ORDER — ONDANSETRON HCL 4 MG/2ML IJ SOLN: 4.0000 mg | Freq: Four times a day (QID) | INTRAMUSCULAR | Status: DC | PRN

## 2022-09-18 MED ORDER — SIMVASTATIN 20 MG PO TABS
20.0000 mg | ORAL_TABLET | ORAL | Status: DC
  Administered 2022-09-19: 20 mg via ORAL
  Filled 2022-09-18: qty 1

## 2022-09-18 MED ORDER — PANTOPRAZOLE SODIUM 40 MG PO TBEC
40.0000 mg | DELAYED_RELEASE_TABLET | Freq: Every day | ORAL | Status: DC
  Administered 2022-09-18 – 2022-09-19 (×2): 40 mg via ORAL
  Filled 2022-09-18 (×2): qty 1

## 2022-09-18 MED ORDER — ACETAMINOPHEN 325 MG PO TABS: 650.0000 mg | ORAL_TABLET | ORAL | Status: DC | PRN

## 2022-09-18 MED ORDER — ALBUTEROL SULFATE (2.5 MG/3ML) 0.083% IN NEBU: 2.5000 mg | INHALATION_SOLUTION | RESPIRATORY_TRACT | Status: DC | PRN

## 2022-09-18 MED ORDER — ALBUTEROL SULFATE HFA 108 (90 BASE) MCG/ACT IN AERS: 2.0000 | INHALATION_SPRAY | RESPIRATORY_TRACT | Status: DC | PRN

## 2022-09-18 MED ORDER — FLUTICASONE FUROATE-VILANTEROL 200-25 MCG/ACT IN AEPB
1.0000 | INHALATION_SPRAY | Freq: Every day | RESPIRATORY_TRACT | Status: DC
  Administered 2022-09-19: 1 via RESPIRATORY_TRACT
  Filled 2022-09-18: qty 28

## 2022-09-18 MED ORDER — ASPIRIN 81 MG PO TBEC
81.0000 mg | DELAYED_RELEASE_TABLET | Freq: Every day | ORAL | Status: DC
  Administered 2022-09-18 – 2022-09-19 (×2): 81 mg via ORAL
  Filled 2022-09-18 (×2): qty 1

## 2022-09-18 MED ORDER — HEPARIN BOLUS VIA INFUSION
4500.0000 [IU] | Freq: Once | INTRAVENOUS | Status: AC
  Administered 2022-09-18: 4500 [IU] via INTRAVENOUS
  Filled 2022-09-18: qty 4500

## 2022-09-18 MED ORDER — HEPARIN (PORCINE) 25000 UT/250ML-% IV SOLN
1300.0000 [IU]/h | INTRAVENOUS | Status: DC
  Administered 2022-09-18 – 2022-09-19 (×2): 1300 [IU]/h via INTRAVENOUS
  Filled 2022-09-18 (×2): qty 250

## 2022-09-18 MED ORDER — METOPROLOL SUCCINATE ER 100 MG PO TB24
100.0000 mg | ORAL_TABLET | Freq: Every day | ORAL | Status: DC
  Administered 2022-09-18 – 2022-09-19 (×2): 100 mg via ORAL
  Filled 2022-09-18: qty 4
  Filled 2022-09-18: qty 1

## 2022-09-18 NOTE — ED Notes (Signed)
PA at triage notified on patient's elevated Troponin.

## 2022-09-18 NOTE — Progress Notes (Addendum)
ANTICOAGULATION CONSULT NOTE - Initial Consult  Pharmacy Consult for heparin Indication: Afib  No Known Allergies  Patient Measurements: Height: 5' 9.02" (175.3 cm) Weight: 96.2 kg (212 lb 1.3 oz) IBW/kg (Calculated) : 70.74 Heparin Dosing Weight: 90.8  Temp: 97.9 F (36.6 C) (10/20 0810) Temp Source: Oral (10/20 0810) BP: 127/96 (10/20 0753) Pulse Rate: 68 (10/20 0753)  Labs: Recent Labs    09/18/22 0107 09/18/22 0340 09/18/22 0605  HGB 13.5  --   --   HCT 41.0  --   --   PLT 205  --   --   CREATININE 1.42*  --   --   TROPONINIHS  --  77* 172*    Estimated Creatinine Clearance: 51.4 mL/min (A) (by C-G formula based on SCr of 1.42 mg/dL (H)).   Medical History: Past Medical History:  Diagnosis Date   Asthma    CAD S/P percutaneous coronary angioplasty 1998   BMS to OM, followed by DES LAD in 2003: PTCA of a  side branch.; Patent stents in 2005.   COPD (chronic obstructive pulmonary disease) ([facility])    Erectile dysfunction    GERD (gastroesophageal reflux disease)    History of stress test 01/05/2004   abnormal stress test, positive bruce protocol exercise stress test with scintigraphic evidence of mild lateral wall ischemia at the apex. Dynamic gating reveals a preserved EF at 60% by QGS   Hypercholesteremia    Hypertension    Psoriasis    Patchy psoriasis   Psoriatic arthritis ([facility])    hips    Assessment: 76 yo M with PMH of CAD presenting with epigastric pain. EKG shoed afib with RVR with no evidence of ACS. He is not on anticoagulation prior to admission.   Hgb 14, plt 210  Goal of Therapy:  Heparin level 0.3-0.7 units/ml Monitor platelets by anticoagulation protocol: Yes   Plan:  Give 4500 units bolus x 1 Start heparin infusion at 1300 units/hr Check anti-Xa level in 8 hours and daily while on heparin Continue to monitor H&H and platelets  Titus Dubin, PharmD PGY1 Pharmacy Resident 09/18/2022 8:35 AM

## 2022-09-18 NOTE — H&P (Addendum)
Cardiology Admission History and Physical   Patient ID: TREVYON SWOR MRN: [id number]; DOB: [birth year]   Admission date: 09/18/2022  PCP:  Clinic, [place] [facility] Cardiologist:  Glenetta Hew, MD        Chief Complaint:  chest burning, palpitation  Patient Profile:   Brad Owen is a 76 y.o. male with COPD, CAD, hypertension, hyperlipidemia and erectile dysfunction who is being seen 09/18/2022 for the evaluation of atrial fibrillation with RVR and elevated troponin.  History of Present Illness:   Brad Owen is a pleasant 76 year old veteran with past medical history of COPD, CAD, hypertension, hyperlipidemia and erectile dysfunction.  He had a bare-metal stent to OM in 1998.  Last PCI was DES to OM in 2003.  Cardiac catheterization in 2005 showed patent stent.  Myoview in 2018 and again in February 2020 were low risk.  Patient gets majority of his care from [facility].  He reportedly had another stress test 1.5 years ago at [place] that was negative.  Since 2003, he has quit smoking, however he picked up chewing tobacco in the recent years.  He was last seen via virtual visit by Rosaria Ferries, PA-C on 02/06/2020 at which time he was doing well.  In the past several weeks, he has been having increased intermittent upper chest burning sensation, shortness of breath with exertion and palpitations.  He was sitting down watching TV on the night of 09/17/2022 when he had sudden onset of worsening upper chest burning, palpitation, shortness of breath, weakness and dizziness.  He checked his blood pressure at home and it was in the 180s.  He says the chest burning sensation feels different from his previous angina in 2003. He does have shakiness during the episode last night. He took 3 nitroglycerin without improvement. Total duration of the symptoms only lasted 10 to 15 minutes. He called EMS, strips obtained by EMS showed atrial fibrillation with RVR.  He  spontaneously converted in the ED back to normal sinus rhythm.   While in A-fib, EKG showed right bundle branch block with left posterior fascicular block.  However, after resolution of A-fib, his QRS narrowed and right bundle branch block resolved.   Serial troponin was elevated at 77--> 172.  TSH elevated at 6.2.  Urinalysis negative.  White blood cell count borderline elevated to 11.2.  Creatinine elevated to 1.42.  He denies any recent fever or chill. Cardiology consulted for atrial fibrillation.   Past Medical History:  Diagnosis Date   Asthma    CAD S/P percutaneous coronary angioplasty 1998   BMS to OM, followed by DES LAD in 2003: PTCA of a  side branch.; Patent stents in 2005.   COPD (chronic obstructive pulmonary disease) (HCC)    Erectile dysfunction    GERD (gastroesophageal reflux disease)    History of stress test 01/05/2004   abnormal stress test, positive bruce protocol exercise stress test with scintigraphic evidence of mild lateral wall ischemia at the apex. Dynamic gating reveals a preserved EF at 60% by QGS   Hypercholesteremia    Hypertension    Psoriasis    Patchy psoriasis   Psoriatic arthritis ([place])    hips    Past Surgical History:  Procedure Laterality Date   CARDIAC CATHETERIZATION  03/1998   Normal left ventricular systolic function, Minor narrowing in the body of the stent, minor tapering in the distal RCA at the area of the PDA.   CARDIAC CATHETERIZATION  02.2005  Normal left ventricular systolic function, patent stents in both the circumflex and left anterior descending with only minimal in-stent restenosis in the left anterior descending.   CHOLECYSTECTOMY  06/05/2014   CORONARY ANGIOPLASTY  12/06/2001   OM: cutting balloon PTCA   CORONARY STENT PLACEMENT  06/06/1997   90% Prox LAD - 3.0 x 18 mm AVE (GFX) BMS stent    CORONARY STENT PLACEMENT  05/10/2002   LCx: Cypher DES 3. x 18, rescue Cutting Balloon PTCA of jailed Ostial OM    NM MYOVIEW LTD   09/16; 10/'18   a) Normal, low risk study. EF 55-60%.; b) LOW RISK . EF 57% with no RWMA.  No ischemia or infarction.   NM MYOVIEW LTD  01/24/2019   [facility]: EF 67%.  No reversible ischemia.  No infarct.     Medications Prior to Admission: Prior to Admission medications   Medication Sig Start Date End Date Taking? Authorizing Provider  Adalimumab (HUMIRA PEN) 40 MG/0.8ML PNKT Inject 40 mg into the skin every 14 (fourteen) days.   Yes [provider]  albuterol (PROVENTIL HFA;VENTOLIN HFA) 108 (90 BASE) MCG/ACT inhaler Inhale 2 puffs into the lungs every 6 (six) hours as needed for wheezing or shortness of breath. Patient taking differently: Inhale 2 puffs into the lungs as needed for wheezing or shortness of breath. 06/18/15   Alycia Rossetti, MD  aspirin EC 81 MG tablet Take 81 mg by mouth daily.    [provider]  budesonide-formoterol (SYMBICORT) 160-4.5 MCG/ACT inhaler Inhale 2 puffs into the lungs 2 (two) times daily. 11/11/15   Susy Frizzle, MD  fluticasone (FLONASE) 50 MCG/ACT nasal spray PLACE 2 SPRAYS INTO BOTH NOSTRILS DAILY AS NEEDED FOR ALLERGY CONGESTION 05/26/18   Susy Frizzle, MD  ibuprofen (ADVIL) 800 MG tablet Take 800 mg by mouth 2 (two) times daily as needed. 05/27/22   [provider]  methocarbamol (ROBAXIN) 500 MG tablet Take 500 mg by mouth 2 (two) times daily. 05/27/22   [provider]  metoprolol succinate (TOPROL-XL) 50 MG 24 hr tablet Take 1 tablet (50 mg total) by mouth daily. Take with or immediately following a meal. 02/06/20   Barrett, Evelene Croon, PA-C  metoprolol tartrate (LOPRESSOR) 50 MG tablet Take 50 mg by mouth daily. 08/10/22   [provider]  nitroGLYCERIN (NITROSTAT) 0.4 MG SL tablet Place 1 tablet (0.4 mg total) under the tongue every 5 (five) minutes as needed for chest pain. 02/06/20   Barrett, Evelene Croon, PA-C  pantoprazole (PROTONIX) 40 MG tablet Take 1 tablet (40 mg total) by mouth daily. 02/06/20    Barrett, Evelene Croon, PA-C  simvastatin (ZOCOR) 20 MG tablet Take 1 tablet (20 mg total) by mouth every morning. 02/06/20   Barrett, Evelene Croon, PA-C     Allergies:   No Known Allergies  Social History:   Social History   Socioeconomic History   Marital status: Married    Spouse name: Not on file   Number of children: Not on file   Years of education: Not on file   Highest education level: Not on file  Occupational History   Not on file  Tobacco Use   Smoking status: Former   Smokeless tobacco: Current    Types: Chew  Substance and Sexual Activity   Alcohol use: Yes    Alcohol/week: 0.0 standard drinks of alcohol    Comment: occassionally   Drug use: No   Sexual activity: Not on file  Other  Topics Concern   Not on file  Social History Narrative   He is a married father of 2. He has 3 stepchildren, he has 10 grandchildren and 2 great   Grandchildren. He chews tobacco, does not   smoke. He seldom drinks alcohol.    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 He walks well over the golf course without any significant difficulties. Besides this he really does get routine exercise.    Social Determinants of Health   Financial Resource Strain: Not on file  Food Insecurity: Not on file  Transportation Needs: Not on file  Physical Activity: Not on file  Stress: Not on file  Social Connections: Not on file  Intimate Partner Violence: Not on file    Family History:   The patient's family history includes Diabetes in his father and sister; Heart Problems in his father and mother.    ROS:  Please see the history of present illness.  All other ROS reviewed and negative.     Physical Exam/Data:   Vitals:   09/18/22 0810 09/18/22 0934 09/18/22 1130 09/18/22 1138  BP:  129/71 128/72 128/72  Pulse:  67 (!) 55 (!) 55  Resp:  20  18  Temp: 97.9 F (36.6 C)     TempSrc:  Oral     SpO2:  97% 98% 98%  Weight: 96.2 kg     Height: 5' 9.02" (1.753 m)       Intake/Output Summary (Last 24 hours) at 09/18/2022 1208 Last data filed at 09/18/2022 9518 Gross per 24 hour  Intake --  Output 300 ml  Net -300 ml      09/18/2022    8:10 AM 02/06/2020   10:05 AM 06/16/2019    8:23 AM  Last 3 Weights  Weight (lbs) 212 lb 1.3 oz 194 lb 212 lb  Weight (kg) 96.2 kg 87.998 kg 96.163 kg     Body mass index is 31.3 kg/m.  General:  Well nourished, well developed, in no acute distress HEENT: normal Neck: no JVD Vascular: No carotid bruits; Distal pulses 2+ bilaterally   Cardiac:  normal S1, S2; RRR; no murmur  Lungs:  clear to auscultation bilaterally, no wheezing, rhonchi or rales  Abd: soft, nontender, no hepatomegaly  Ext: no edema Musculoskeletal:  No deformities, BUE and BLE strength normal and equal Skin: warm and dry  Neuro:  CNs 2-12 intact, no focal abnormalities noted Psych:  Normal affect    EKG:  The ECG that was done and was personally reviewed and demonstrates atrial fibrillation with RVR with RBBB  Relevant CV Studies:  Myoview 09/15/2017 The left ventricular ejection fraction is normal (55-65%). Nuclear stress EF: 57%. There was no ST segment deviation noted during stress. This is a low risk study.   Low risk stress nuclear study with no ischemia or infarction; EF 57 with normal wall motion.  Laboratory Data:  High Sensitivity Troponin:   Recent Labs  Lab 09/18/22 0340 09/18/22 0605  TROPONINIHS 77* 172*      Chemistry Recent Labs  Lab 09/18/22 0107 09/18/22 0719  NA 143  --   K 3.5  --   CL 107  --   CO2 27  --   GLUCOSE 129*  --   BUN 15  --   CREATININE 1.42*  --   CALCIUM 9.7  --  MG  --  2.0  GFRNONAA 52*  --   ANIONGAP 9  --     Recent Labs  Lab 09/18/22 0107  PROT 8.1  ALBUMIN 4.0  AST 18  ALT 19  ALKPHOS 71  BILITOT 1.2   Lipids No results for input(s): "CHOL", "TRIG", "HDL", "LABVLDL", "LDLCALC",  "CHOLHDL" in the last 168 hours. Hematology Recent Labs  Lab 09/18/22 0107  WBC 11.2*  RBC 4.50  HGB 13.5  HCT 41.0  MCV 91.1  MCH 30.0  MCHC 32.9  RDW 12.9  PLT 205   Thyroid  Recent Labs  Lab 09/18/22 0719  TSH 6.216*   BNP Recent Labs  Lab 09/18/22 0719  BNP 60.0    DDimer No results for input(s): "DDIMER" in the last 168 hours.   Radiology/Studies:  DG Chest Portable 1 View  Result Date: 09/18/2022 CLINICAL DATA:  Encounter for atrial fibrillation. EXAM: PORTABLE CHEST 1 VIEW COMPARISON:  PA Lat 10/29/2015 FINDINGS: The heart size and mediastinal contours are within normal limits. Both lungs are clear. The visualized skeletal structures are unremarkable. IMPRESSION: No active disease. Electronically Signed   By: Telford Nab M.D.   On: 09/18/2022 01:21     Assessment and Plan:   Newly diagnosed atrial fibrillation with RVR -Patient has been noticing dyspnea on exertion and intermittent palpitation with upper chest burning sensation for the past several weeks. -Had worsening symptoms while watching TV around 10:30 PM on the night of 10/19.  EMS strips revealed atrial fibrillation with RVR. -Started on IV heparin.  Increase home dose of metoprolol succinate 200 mg daily.  Obtain echocardiogram given elevation of the troponin - continue IV heparin, convert to Eliquis PO tomorrow if Echo is normal and no further study planned during this admission.   Chest burning with elevated troponin: Unclear if elevation of the troponin the chest burning sensation was due to atrial fibrillation.  Chest burning is different from his previous anginal symptom from 2003.   Will need to consider ischemic evaluation-if echo is abnormal, would probably consider inpatient evaluation with [facility]; however if echo is normal, can probably consider outpatient Lexiscan Myoview.  CAD: Patient had a BMS to OM in 1998 followed by DES to OM in 2003.  Had a negative Myoview in 2020 at [place].   Patient reportedly had a Myoview 1.5 years ago at [place], however I am not sure if he was referring to the 2020 Myoview or not. See above-need to exclude ischemic etiology since he did have notable chest discomfort and mild troponin elevation.  If troponins continue to trend up, or if echo is abnormal, would probably do inpatient assessment, however if troponin trend goes back down and echo is normal, would consider outpatient stress testing.  Hypertension: Blood pressure was in the 180s when he went into A-fib last night, however it has normalized since.  Hyperlipidemia: On simvastatin    Risk Assessment/Risk Scores:    TIMI Risk Score for Unstable Angina or Non-ST Elevation MI:   The patient's TIMI risk score is 6, which indicates a 41% risk of all cause mortality, new or recurrent myocardial infarction or need for urgent revascularization in the next 14 days.    CHA2DS2-VASc Score = 4   This indicates a 4.8% annual risk of stroke. The patient's score is based upon: CHF History: 0 HTN History: 1 Diabetes History: 0 Stroke History: 0 Vascular Disease History: 1 Age Score: 2 Gender Score: 0      Severity of  Illness: The appropriate patient status for this patient is OBSERVATION. Observation status is judged to be reasonable and necessary in order to provide the required intensity of service to ensure the patient's safety. The patient's presenting symptoms, physical exam findings, and initial radiographic and laboratory data in the context of their medical condition is felt to place them at decreased risk for further clinical deterioration. Furthermore, it is anticipated that the patient will be medically stable for discharge from the hospital within 2 midnights of admission.    For questions or updates, please contact [place] Please consult [email or web address] for contact info under     Signed, Almyra Deforest, [place]  09/18/2022 12:08 PM   ATTENDING ATTESTATION  I have seen,  examined and evaluated the patient this afternoon in the ER along with Almyra Deforest, PA.  After reviewing all the available data and chart, we discussed the patients laboratory, study & physical findings as well as symptoms in detail.  I agree with his findings, examination as well as impression recommendations as per our discussion.    Attending adjustments noted in italics.   New onset A-fib RVR, was very symptomatic while in A-fib, but once he converted in sinus rhythm, symptoms went away.  He says his symptoms did not feel like his memory of what his anginal equivalent was, but its been a long time.  He did have mild troponin elevation. Thank you, now he remains in sinus rhythm but with frequent PACs.  We have increase his beta-blocker dose, he is now on IV heparin with plans to switch to DOAC tomorrow if echo was normal.  If echo is abnormal, would do inpatient stress test, if if normal, will do outpatient.  Started on DOAC, okay to stop antiplatelet agent.    Leonie Man, MD, MS Glenetta Hew, M.D., M.S. Interventional Cardiologist  [place]  Pager # [phone] Phone # [phone] [address] [place], [place] [address]

## 2022-09-18 NOTE — ED Provider Triage Note (Signed)
Emergency Medicine Provider Triage Evaluation Note  Brad Owen , a 76 y.o. male  was evaluated in triage.  Pt complains of weakness and throat tightness.  When EMS arrived, patient was noted to be alternating between sinus rhythm with PVCs and going into intermittent atrial fibrillation with RVR.  No prior history.  Patient has a history of 2 stents.  Denies any chest pain at present.  States he feels weak.  Review of Systems  Positive: As above Negative: As above  Physical Exam  BP 128/78 (BP Location: Right Arm)   Pulse 66   Temp 98.2 F (36.8 C) (Oral)   Resp 18   SpO2 95%  Gen:   Awake, no distress   Resp:  Normal effort  MSK:   Moves extremities without difficulty  Other:  Heart rate irregularly irregular, with a rate in the 150s  Medical Decision Making  Medically screening exam initiated at 12:59 AM.  Appropriate orders placed.  [name] was informed that the remainder of the evaluation will be completed by another provider, this initial triage assessment does not replace that evaluation, and the importance of remaining in the ED until their evaluation is complete.  On my exam, patient is irregularly irregular, with a heart rate in the 150s.  Charge RN notified to expedite patient being brought back to to the treatment area.  He remains hemodynamically stable at this time we will continue to monitor.  Lab work initiated.   Garald Balding, PA-C 09/18/22 [phone]

## 2022-09-18 NOTE — ED Provider Notes (Signed)
[facility] EMERGENCY DEPARTMENT Provider Note   CSN: [id number] Arrival date & time: 09/18/22  0048     History  Chief Complaint  Patient presents with   Tachycardia /Gen. Weakness    Brad Owen is a 76 y.o. male.  76 year old male with history of CAD status post coronary artery stent presents with chest indigestion was lasted for approximately 10 minutes.  Has had intermittent palpitations.  Denies differential A-fib.  Was short of breath and weak with his symptoms.  Patient took nitroglycerin x3 with unclear result.  Notes he just feels weak at this time.  Denies any fever or chills.  No vomiting or diarrhea.  He is currently pain-free.       Home Medications Prior to Admission medications   Medication Sig Start Date End Date Taking? Authorizing Provider  Adalimumab (HUMIRA PEN) 40 MG/0.8ML PNKT Inject 40 mg as directed into the skin.     [provider]  albuterol (PROVENTIL HFA;VENTOLIN HFA) 108 (90 BASE) MCG/ACT inhaler Inhale 2 puffs into the lungs every 6 (six) hours as needed for wheezing or shortness of breath. 06/18/15   Alycia Rossetti, MD  aspirin EC 81 MG tablet Take 81 mg by mouth daily.    [provider]  budesonide-formoterol (SYMBICORT) 160-4.5 MCG/ACT inhaler Inhale 2 puffs into the lungs 2 (two) times daily. 11/11/15   Susy Frizzle, MD  fluticasone (FLONASE) 50 MCG/ACT nasal spray PLACE 2 SPRAYS INTO BOTH NOSTRILS DAILY AS NEEDED FOR ALLERGY CONGESTION 05/26/18   Susy Frizzle, MD  metoprolol succinate (TOPROL-XL) 50 MG 24 hr tablet Take 1 tablet (50 mg total) by mouth daily. Take with or immediately following a meal. 02/06/20   Barrett, Evelene Croon, PA-C  nitroGLYCERIN (NITROSTAT) 0.4 MG SL tablet Place 1 tablet (0.4 mg total) under the tongue every 5 (five) minutes as needed for chest pain. 02/06/20   Barrett, Evelene Croon, PA-C  pantoprazole (PROTONIX) 40 MG tablet Take 1 tablet (40 mg total) by mouth daily. 02/06/20    Barrett, Evelene Croon, PA-C  simvastatin (ZOCOR) 20 MG tablet Take 1 tablet (20 mg total) by mouth every morning. 02/06/20   Barrett, Evelene Croon, PA-C      Allergies    Patient has no known allergies.    Review of Systems   Review of Systems  All other systems reviewed and are negative.   Physical Exam Updated Vital Signs BP (!) 127/96   Pulse 68   Temp 97.9 F (36.6 C) (Oral)   Resp 20   SpO2 98%  Physical Exam Vitals and nursing note reviewed.  Constitutional:      General: He is not in acute distress.    Appearance: Normal appearance. He is well-developed. He is not toxic-appearing.  HENT:     Head: Normocephalic and atraumatic.  Eyes:     General: Lids are normal.     Conjunctiva/sclera: Conjunctivae normal.     Pupils: Pupils are equal, round, and reactive to light.  Neck:     Thyroid: No thyroid mass.     Trachea: No tracheal deviation.  Cardiovascular:     Rate and Rhythm: Normal rate and regular rhythm.     Heart sounds: Normal heart sounds. No murmur heard.    No gallop.  Pulmonary:     Effort: Pulmonary effort is normal. No respiratory distress.     Breath sounds: Normal breath sounds. No stridor. No decreased breath sounds, wheezing, rhonchi or rales.  Abdominal:  General: There is no distension.     Palpations: Abdomen is soft.     Tenderness: There is no abdominal tenderness. There is no rebound.  Musculoskeletal:        General: No tenderness. Normal range of motion.     Cervical back: Normal range of motion and neck supple.  Skin:    General: Skin is warm and dry.     Findings: No abrasion or rash.  Neurological:     Mental Status: He is alert and oriented to person, place, and time. Mental status is at baseline.     GCS: GCS eye subscore is 4. GCS verbal subscore is 5. GCS motor subscore is 6.     Cranial Nerves: No cranial nerve deficit.     Sensory: No sensory deficit.     Motor: Motor function is intact.  Psychiatric:        Attention and  Perception: Attention normal.        Speech: Speech normal.        Behavior: Behavior normal.     ED Results / Procedures / Treatments   Labs (all labs ordered are listed, but only abnormal results are displayed) Labs Reviewed  CBC WITH DIFFERENTIAL/PLATELET - Abnormal; Notable for the following components:      Result Value   WBC 11.2 (*)    Monocytes Absolute 1.1 (*)    Eosinophils Absolute 0.7 (*)    All other components within normal limits  COMPREHENSIVE METABOLIC PANEL - Abnormal; Notable for the following components:   Glucose, Bld 129 (*)    Creatinine, Ser 1.42 (*)    GFR, Estimated 52 (*)    All other components within normal limits  URINALYSIS, ROUTINE W REFLEX MICROSCOPIC - Abnormal; Notable for the following components:   Color, Urine STRAW (*)    All other components within normal limits  TSH - Abnormal; Notable for the following components:   TSH 6.216 (*)    All other components within normal limits  TROPONIN I (HIGH SENSITIVITY) - Abnormal; Notable for the following components:   Troponin I (High Sensitivity) 77 (*)    All other components within normal limits  TROPONIN I (HIGH SENSITIVITY) - Abnormal; Notable for the following components:   Troponin I (High Sensitivity) 172 (*)    All other components within normal limits  MAGNESIUM  BRAIN NATRIURETIC PEPTIDE    EKG EKG Interpretation  Date/Time:  Friday September 18 2022 00:50:41 EDT Ventricular Rate:  144 PR Interval:    QRS Duration: 112 QT Interval:  298 QTC Calculation: 461 R Axis:   115 Text Interpretation: Atrial fibrillation with rapid ventricular response Right bundle branch block Left posterior fascicular block Bifascicular blockAbnormal ECG When compared with ECG of 12-Dec-2013 11:33, PREVIOUS ECG IS PRESENT Confirmed by Lacretia Leigh ([id number]) on 09/18/2022 8:12:16 AM  Radiology DG Chest Portable 1 View  Result Date: 09/18/2022 CLINICAL DATA:  Encounter for atrial fibrillation. EXAM:  PORTABLE CHEST 1 VIEW COMPARISON:  PA Lat 10/29/2015 FINDINGS: The heart size and mediastinal contours are within normal limits. Both lungs are clear. The visualized skeletal structures are unremarkable. IMPRESSION: No active disease. Electronically Signed   By: Telford Nab M.D.   On: 09/18/2022 01:21    Procedures Procedures    Medications Ordered in ED Medications - No data to display  ED Course/ Medical Decision Making/ A&P  Medical Decision Making  Patient's EKG initially showed A-fib with RVR.  He spontaneously converted back to sinus rhythm which is seen in a repeat EKG which per my interpretation shows a rate of 83 and in sinus rhythm.  Patient's troponins were noted to be escalating.  He is currently pain-free at this time.  No evidence of ACS.  Will be started on heparin per pharmacy.  His urinalysis negative at this time for infection.  Plan will be for admission to cardiology for further evaluation.   CRITICAL CARE Performed by: Leota Jacobsen Total critical care time: 45 minutes Critical care time was exclusive of separately billable procedures and treating other patients. Critical care was necessary to treat or prevent imminent or life-threatening deterioration. Critical care was time spent personally by me on the following activities: development of treatment plan with patient and/or surrogate as well as nursing, discussions with consultants, evaluation of patient's response to treatment, examination of patient, obtaining history from patient or surrogate, ordering and performing treatments and interventions, ordering and review of laboratory studies, ordering and review of radiographic studies, pulse oximetry and re-evaluation of patient's condition.        Final Clinical Impression(s) / ED Diagnoses Final diagnoses:  None    Rx / DC Orders ED Discharge Orders     None         Lacretia Leigh, MD 09/18/22 [phone]

## 2022-09-18 NOTE — ED Triage Notes (Signed)
Patient arrived with EMS from home reports Gen. weakness with SOB and tachycardia this evening , history of CAD/Coronary stent and MI , he took ASA 324 mg prior to arrival .

## 2022-09-18 NOTE — Progress Notes (Signed)
ANTICOAGULATION CONSULT NOTE - Initial Consult  Pharmacy Consult for heparin Indication: Afib  No Known Allergies  Patient Measurements: Height: '5\' 10"'$  (177.8 cm) Weight: 96.4 kg (212 lb 8.4 oz) IBW/kg (Calculated) : 73 Heparin Dosing Weight: 90.8  Temp: 97.9 F (36.6 C) (10/20 1731) Temp Source: Oral (10/20 1731) BP: 131/73 (10/20 1731) Pulse Rate: 78 (10/20 1731)  Labs: Recent Labs    09/18/22 0107 09/18/22 0340 09/18/22 0605 09/18/22 1635  HGB 13.5  --   --   --   HCT 41.0  --   --   --   PLT 205  --   --   --   HEPARINUNFRC  --   --   --  0.37  CREATININE 1.42*  --   --   --   TROPONINIHS  --  77* 172*  --      Estimated Creatinine Clearance: 52.4 mL/min (A) (by C-G formula based on SCr of 1.42 mg/dL (H)).   Medical History: Past Medical History:  Diagnosis Date   Asthma    CAD S/P percutaneous coronary angioplasty 1998   BMS to OM, followed by DES LAD in 2003: PTCA of a  side branch.; Patent stents in 2005.   COPD (chronic obstructive pulmonary disease) (HCC)    Erectile dysfunction    GERD (gastroesophageal reflux disease)    History of stress test 01/05/2004   abnormal stress test, positive bruce protocol exercise stress test with scintigraphic evidence of mild lateral wall ischemia at the apex. Dynamic gating reveals a preserved EF at 60% by QGS   Hypercholesteremia    Hypertension    Psoriasis    Patchy psoriasis   Psoriatic arthritis (HCC)    hips    Assessment: 76 yo M with PMH of CAD presenting with epigastric pain. EKG shoed afib with RVR with no evidence of ACS. He is not on anticoagulation prior to admission.   Hgb 14, plt 210  Heparin came back therapeutic this PM. We will continue with current rate and check level in AM.   Goal of Therapy:  Heparin level 0.3-0.7 units/ml Monitor platelets by anticoagulation protocol: Yes   Plan:  Continue heparin at 1300 units/hr Daily heparin level and CBC  Onnie Boer, PharmD, BCIDP, AAHIVP,  CPP Infectious Disease Pharmacist 09/18/2022 6:17 PM

## 2022-09-18 NOTE — ED Notes (Signed)
Pt ambulatory to the bathroom with staff available for assistance

## 2022-09-19 ENCOUNTER — Other Ambulatory Visit: Payer: Self-pay | Admitting: Physician Assistant

## 2022-09-19 ENCOUNTER — Other Ambulatory Visit (HOSPITAL_COMMUNITY): Payer: No Typology Code available for payment source

## 2022-09-19 ENCOUNTER — Observation Stay (HOSPITAL_BASED_OUTPATIENT_CLINIC_OR_DEPARTMENT_OTHER): Payer: No Typology Code available for payment source

## 2022-09-19 ENCOUNTER — Encounter (HOSPITAL_COMMUNITY): Payer: Self-pay | Admitting: Cardiology

## 2022-09-19 DIAGNOSIS — R072 Precordial pain: Secondary | ICD-10-CM

## 2022-09-19 DIAGNOSIS — I2510 Atherosclerotic heart disease of native coronary artery without angina pectoris: Secondary | ICD-10-CM

## 2022-09-19 DIAGNOSIS — I10 Essential (primary) hypertension: Secondary | ICD-10-CM | POA: Diagnosis not present

## 2022-09-19 DIAGNOSIS — N183 Chronic kidney disease, stage 3 unspecified: Secondary | ICD-10-CM

## 2022-09-19 DIAGNOSIS — R7989 Other specified abnormal findings of blood chemistry: Secondary | ICD-10-CM | POA: Diagnosis not present

## 2022-09-19 DIAGNOSIS — I4891 Unspecified atrial fibrillation: Secondary | ICD-10-CM

## 2022-09-19 DIAGNOSIS — E785 Hyperlipidemia, unspecified: Secondary | ICD-10-CM

## 2022-09-19 DIAGNOSIS — Z9861 Coronary angioplasty status: Secondary | ICD-10-CM

## 2022-09-19 HISTORY — DX: Chronic kidney disease, stage 3 unspecified: N18.30

## 2022-09-19 LAB — ECHOCARDIOGRAM COMPLETE
AR max vel: 1.84 cm2
AV Area VTI: 1.67 cm2
AV Area mean vel: 1.71 cm2
AV Mean grad: 11 mmHg
AV Peak grad: 20.6 mmHg
Ao pk vel: 2.27 m/s
Area-P 1/2: 2.87 cm2
Height: 70 in
S' Lateral: 3.1 cm
Weight: 3392 oz

## 2022-09-19 LAB — T4, FREE: Free T4: 0.73 ng/dL (ref 0.61–1.12)

## 2022-09-19 LAB — LIPID PANEL
Cholesterol: 126 mg/dL (ref 0–200)
HDL: 39 mg/dL — ABNORMAL LOW (ref >40)
LDL Cholesterol: 70 mg/dL (ref 0–99)
Total CHOL/HDL Ratio: 3.2 RATIO
Triglycerides: 84 mg/dL (ref <150)
VLDL: 17 mg/dL (ref 0–40)

## 2022-09-19 LAB — BASIC METABOLIC PANEL
Anion gap: 10 (ref 5–15)
BUN: 21 mg/dL (ref 8–23)
CO2: 25 mmol/L (ref 22–32)
Calcium: 9.2 mg/dL (ref 8.9–10.3)
Chloride: 103 mmol/L (ref 98–111)
Creatinine, Ser: 1.58 mg/dL — ABNORMAL HIGH (ref 0.61–1.24)
GFR, Estimated: 45 mL/min — ABNORMAL LOW (ref >60)
Glucose, Bld: 112 mg/dL — ABNORMAL HIGH (ref 70–99)
Potassium: 3.8 mmol/L (ref 3.5–5.1)
Sodium: 138 mmol/L (ref 135–145)

## 2022-09-19 LAB — CBC
HCT: 33.9 % — ABNORMAL LOW (ref 39.0–52.0)
Hemoglobin: 11.2 g/dL — ABNORMAL LOW (ref 13.0–17.0)
MCH: 29.4 pg (ref 26.0–34.0)
MCHC: 33 g/dL (ref 30.0–36.0)
MCV: 89 fL (ref 80.0–100.0)
Platelets: 148 10*3/uL — ABNORMAL LOW (ref 150–400)
RBC: 3.81 MIL/uL — ABNORMAL LOW (ref 4.22–5.81)
RDW: 12.8 % (ref 11.5–15.5)
WBC: 8.9 10*3/uL (ref 4.0–10.5)
nRBC: 0 % (ref 0.0–0.2)

## 2022-09-19 LAB — HEPARIN LEVEL (UNFRACTIONATED): Heparin Unfractionated: 0.5 IU/mL (ref 0.30–0.70)

## 2022-09-19 MED ORDER — APIXABAN 5 MG PO TABS: 5.0000 mg | ORAL_TABLET | Freq: Two times a day (BID) | ORAL | 11 refills | Status: DC

## 2022-09-19 MED ORDER — APIXABAN 5 MG PO TABS
5.0000 mg | ORAL_TABLET | Freq: Two times a day (BID) | ORAL | Status: DC
  Administered 2022-09-19: 5 mg via ORAL
  Filled 2022-09-19: qty 1

## 2022-09-19 MED ORDER — METOPROLOL SUCCINATE ER 100 MG PO TB24: 100.0000 mg | ORAL_TABLET | Freq: Every day | ORAL | 11 refills | Status: DC

## 2022-09-19 NOTE — TOC Transition Note (Signed)
Transition of Care [facility]) - CM/SW Discharge Note   Patient Details  Name: Brad Owen MRN: [id number] Date of Birth: [birth year]  Transition of Care [facility]) CM/SW Contact:  Carles Collet, RN Phone Number: 09/19/2022, 1:42 PM   Clinical Narrative:     Damaris Schooner w patient's wife at bedside while he was on the phone. Provided her with 30 day Eliquis card. Confirmed he gets his meds through the [place]. In[facility]ed [facility] to make them aware he has started Eliquis and they can follow up for refills.  No other TOC needs identified.         Patient Goals and CMS Choice        Discharge Placement                       Discharge Plan and Services                                     Social Determinants of Health (SDOH) Interventions     Readmission Risk Interventions     No data to display

## 2022-09-19 NOTE — Progress Notes (Signed)
ANTICOAGULATION CONSULT NOTE - Initial Consult  Pharmacy Consult for heparin Indication: Afib  No Known Allergies  Patient Measurements: Height: '5\' 10"'$  (177.8 cm) Weight: 96.2 kg (212 lb) IBW/kg (Calculated) : 73 Heparin Dosing Weight: 90.8  Temp: 98.5 F (36.9 C) (10/21 0745) Temp Source: Oral (10/21 0745) BP: 118/66 (10/21 0745) Pulse Rate: 68 (10/21 0745)  Labs: Recent Labs    09/18/22 0107 09/18/22 0340 09/18/22 0605 09/18/22 1635 09/19/22 0104 09/19/22 0819  HGB 13.5  --   --   --  11.2*  --   HCT 41.0  --   --   --  33.9*  --   PLT 205  --   --   --  148*  --   HEPARINUNFRC  --   --   --  0.37  --  0.50  CREATININE 1.42*  --   --   --  1.58*  --   TROPONINIHS  --  77* 172*  --   --   --      Estimated Creatinine Clearance: 47 mL/min (A) (by C-G formula based on SCr of 1.58 mg/dL (H)).   Medical History: Past Medical History:  Diagnosis Date   Asthma    CAD S/P percutaneous coronary angioplasty 1998   BMS to OM, followed by DES LAD in 2003: PTCA of a  side branch.; Patent stents in 2005.   COPD (chronic obstructive pulmonary disease) (HCC)    Erectile dysfunction    GERD (gastroesophageal reflux disease)    History of stress test 01/05/2004   abnormal stress test, positive bruce protocol exercise stress test with scintigraphic evidence of mild lateral wall ischemia at the apex. Dynamic gating reveals a preserved EF at 60% by QGS   Hypercholesteremia    Hypertension    Psoriasis    Patchy psoriasis   Psoriatic arthritis (HCC)    hips    Assessment: 76 yo M with PMH of CAD presenting with epigastric pain. EKG shoed afib with RVR with no evidence of ACS. He is not on anticoagulation prior to admission.   Heparin level 0.50 therapeutic on 1300 units/hr. Hgb 11.2, PLT 148 trending down. No issues with infusion or bleeding noted per RN.   Goal of Therapy:  Heparin level 0.3-0.7 units/ml Monitor platelets by anticoagulation protocol: Yes   Plan:   Continue heparin at 1300 units/hr Daily heparin level and CBC Monitor for s/sx bleeding   Eliseo Gum, PharmD PGY1 Pharmacy Resident   09/19/2022  9:16 AM

## 2022-09-19 NOTE — Progress Notes (Signed)
  Echocardiogram 2D Echocardiogram has been performed.  Brad Owen 09/19/2022, 9:25 AM

## 2022-09-19 NOTE — Discharge Summary (Cosign Needed)
Discharge Summary    Patient ID: Brad Owen MRN: [id number]; DOB: Aug 14, 1946  Admit date: 09/18/2022 Discharge date: 09/19/2022  PCP:  [facility] Cardiologist:  Glenetta Hew, MD        Discharge Diagnoses    Principal Problem:   Atrial fibrillation with RVR [facility]) Active Problems:   CAD S/P percutaneous coronary angioplasty   Hyperlipidemia with target low density lipoprotein (LDL) cholesterol less than 70 mg/dL   Essential hypertension   CKD (chronic kidney disease) stage 3, GFR 30-59 ml/min (HCC)   Elevated TSH    Diagnostic Studies/Procedures    Echocardiogram showed normal LVEF and mild AS _____________   History of Present Illness     Brad Owen is a 76 y.o. male with coronary artery disease (s/p BMS to OM in 1998, DES to OM in 2003), Chronic Obstructive Pulmonary Disease, hypertension, hyperlipidemia, ED. He had been having intermittent chest burning, palpitations and dyspnea on exertion for several weeks. He had sudden onset of worsening symptoms on the day of admission with associated elevated BP (systolic 643P). He took NTG x 3 w/o relief. He called EMS and was noted to be in new onset of atrial fibrillation with RVR. In the ED, he spontaneously converted to NSR. Of note, he had a Right Bundle Branch Block, LPFB while in Afib (rate related aberrancy) that resolved in NSR. He was admitted for further evaluation and management.    Hospital Course     Consultants: None    New onset paroxysmal atrial fibrillation with RVR, rate controlled He was started on IV heparin. His home dose of Metoprolol Succinate was increased to 100 mg daily.  IV heparin was transitioned to DOAC therapy with Eliquis 5 mg twice daily.  He remained in sinus rhythm.  Coronary artery disease s/p PCI in 1998 Chest pain Elevated Troponin  His hs Trop was elevated (77 >> 172). An echocardiogram was obtained demonstrated normal LVEF and mild  aortic stenosis. Given normal EF on echocardiogram, it is felt that the patient can pursue outpatient work-up with nuclear stress test.  As he is being placed on Eliquis, aspirin has been discontinued.  Hypertension  Blood pressure remained controlled.  Hyperlipidemia  Simvastatin 20 mg daily was continued.  Chronic kidney disease Serum creatinine remained stable this admission (1.42 >> 1.58)  Elevated TSH TSH this admission was 6.216. Free T4 is normal at 0.73. The patient will need to follow up with his primary care provider for further evaluation.   Disposition  He is doing well without further symptoms of chest discomfort.  He is felt to be stable for discharge to home. He will undergo outpatient stress testing and then see Dr. Ellyn Hack in 1 month.      Did the patient have an acute coronary syndrome (MI, NSTEMI, STEMI, etc) this admission?:  No.   The elevated Troponin was due to the acute medical illness (demand ischemia).   Shared Decision Making/Informed Consent The risks [chest pain, shortness of breath, cardiac arrhythmias, dizziness, blood pressure fluctuations, myocardial infarction, stroke/transient ischemic attack, nausea, vomiting, allergic reaction, radiation exposure, metallic taste sensation and life-threatening complications (estimated to be 1 in 10,000)], benefits (risk stratification, diagnosing coronary artery disease, treatment guidance) and alternatives of a nuclear stress test were discussed in detail with Mr. Console and he agrees to proceed.     _____________  Discharge Vitals Blood pressure 118/66, pulse 68, temperature 98.5 F (36.9 C), temperature source Oral, resp.  rate 18, height '5\' 10"'$  (1.778 m), weight 96.2 kg, SpO2 98 %.  Filed Weights   09/18/22 0810 09/18/22 1731 09/19/22 0459  Weight: 96.2 kg 96.4 kg 96.2 kg    Labs & Radiologic Studies    CBC Recent Labs    09/18/22 0107 09/19/22 0104  WBC 11.2* 8.9  NEUTROABS 5.7  --   HGB 13.5 11.2*   HCT 41.0 33.9*  MCV 91.1 89.0  PLT 205 324*   Basic Metabolic Panel Recent Labs    09/18/22 0107 09/18/22 0719 09/19/22 0104  NA 143  --  138  K 3.5  --  3.8  CL 107  --  103  CO2 27  --  25  GLUCOSE 129*  --  112*  BUN 15  --  21  CREATININE 1.42*  --  1.58*  CALCIUM 9.7  --  9.2  MG  --  2.0  --    Liver Function Tests Recent Labs    09/18/22 0107  AST 18  ALT 19  ALKPHOS 71  BILITOT 1.2  PROT 8.1  ALBUMIN 4.0    High Sensitivity Troponin:   Recent Labs  Lab 09/18/22 0340 09/18/22 0605  TROPONINIHS 77* 172*     Fasting Lipid Panel Recent Labs    09/19/22 0104  CHOL 126  HDL 39*  LDLCALC 70  TRIG 84  CHOLHDL 3.2   Thyroid Function Tests Recent Labs    09/18/22 0719  TSH 6.216*   _____________  DG Chest Portable 1 View  Result Date: 09/18/2022 CLINICAL DATA:  Encounter for atrial fibrillation. EXAM: PORTABLE CHEST 1 VIEW COMPARISON:  PA Lat 10/29/2015 FINDINGS: The heart size and mediastinal contours are within normal limits. Both lungs are clear. The visualized skeletal structures are unremarkable. IMPRESSION: No active disease. Electronically Signed   By: Telford Nab M.D.   On: 09/18/2022 01:21   Disposition   Pt is being discharged home today in good condition.  Follow-up Plans & Appointments     Follow-up Information     Leonie Man, MD Follow up in 1 month(s).   Specialty: Cardiology Why: The office will call you to schedule a stress test then to see Dr. Ellyn Hack in follow up. Contact information: [address]  [place] [id number] [phone]                Discharge Instructions     Diet - low sodium heart healthy   Complete by: As directed    Increase activity slowly   Complete by: As directed         Discharge Medications   Allergies as of 09/19/2022   No Known Allergies      Medication List     STOP taking these medications    aspirin EC 81 MG tablet   metoprolol tartrate  50 MG tablet Commonly known as: LOPRESSOR       TAKE these medications    albuterol 108 (90 Base) MCG/ACT inhaler Commonly known as: VENTOLIN HFA Inhale 2 puffs into the lungs every 6 (six) hours as needed for wheezing or shortness of breath. What changed: when to take this   apixaban 5 MG Tabs tablet Commonly known as: ELIQUIS Take 1 tablet (5 mg total) by mouth 2 (two) times daily.   budesonide-formoterol 160-4.5 MCG/ACT inhaler Commonly known as: SYMBICORT Inhale 2 puffs into the lungs 2 (two) times daily.   fluticasone 50 MCG/ACT nasal spray Commonly known as: FLONASE PLACE 2 SPRAYS  INTO BOTH NOSTRILS DAILY AS NEEDED FOR ALLERGY CONGESTION What changed: See the new instructions.   Humira Pen 40 MG/0.8ML Pnkt Generic drug: Adalimumab Inject 40 mg into the skin every 14 (fourteen) days.   ibuprofen 800 MG tablet Commonly known as: ADVIL Take 800 mg by mouth 2 (two) times daily as needed for mild pain.   methocarbamol 500 MG tablet Commonly known as: ROBAXIN Take 500 mg by mouth 2 (two) times daily.   metoprolol succinate 100 MG 24 hr tablet Commonly known as: TOPROL-XL Take 1 tablet (100 mg total) by mouth daily. Take with or immediately following a meal. Start taking on: September 20, 2022 What changed:  medication strength how much to take   nitroGLYCERIN 0.4 MG SL tablet Commonly known as: NITROSTAT Place 1 tablet (0.4 mg total) under the tongue every 5 (five) minutes as needed for chest pain.   pantoprazole 40 MG tablet Commonly known as: PROTONIX Take 1 tablet (40 mg total) by mouth daily.   PRESERVISION AREDS 2+MULTI VIT PO Take 1 tablet by mouth daily.   simvastatin 20 MG tablet Commonly known as: ZOCOR Take 1 tablet (20 mg total) by mouth every morning.   Systane Balance 0.6 % Soln Generic drug: Propylene Glycol Place 1 drop into both eyes daily as needed (dry eyes).           Outstanding Labs/Studies   Lexiscan Myoview   Duration of  Discharge Encounter   Greater than 30 minutes including physician time.   Signed, Vishnu Fidel Levy, MD

## 2022-09-19 NOTE — Discharge Instructions (Addendum)
**  Please follow up with your primary care provider to evaluate your thyroid. The blood test called TSH was mildly elevated. It should be repeated the next time you see your provider.  Information on my medicine - ELIQUIS (apixaban)  This medication education was reviewed with me or my healthcare representative as part of my discharge preparation.    Why was Eliquis prescribed for you? Eliquis was prescribed for you to reduce the risk of a blood clot forming that can cause a stroke if you have a medical condition called atrial fibrillation (a type of irregular heartbeat).  What do You need to know about Eliquis ? Take your Eliquis TWICE DAILY - one tablet in the morning and one tablet in the evening with or without food. If you have difficulty swallowing the tablet whole please discuss with your pharmacist how to take the medication safely.  Take Eliquis exactly as prescribed by your doctor and DO NOT stop taking Eliquis without talking to the doctor who prescribed the medication.  Stopping may increase your risk of developing a stroke.  Refill your prescription before you run out.  After discharge, you should have regular check-up appointments with your healthcare provider that is prescribing your Eliquis.  In the future your dose may need to be changed if your kidney function or weight changes by a significant amount or as you get older.  What do you do if you miss a dose? If you miss a dose, take it as soon as you remember on the same day and resume taking twice daily.  Do not take more than one dose of ELIQUIS at the same time to make up a missed dose.  Important Safety Information A possible side effect of Eliquis is bleeding. You should call your healthcare provider right away if you experience any of the following: Bleeding from an injury or your nose that does not stop. Unusual colored urine (red or dark brown) or unusual colored stools (red or black). Unusual bruising for  unknown reasons. A serious fall or if you hit your head (even if there is no bleeding).  Some medicines may interact with Eliquis and might increase your risk of bleeding or clotting while on Eliquis. To help avoid this, consult your healthcare provider or pharmacist prior to using any new prescription or non-prescription medications, including herbals, vitamins, non-steroidal anti-inflammatory drugs (NSAIDs) and supplements.  This website has more information on Eliquis (apixaban): http://www.eliquis.com/eliquis/home

## 2022-09-19 NOTE — Progress Notes (Addendum)
Progress Note  Patient Name: Brad Owen Date of Encounter: 09/19/2022  Primary Cardiologist: Glenetta Hew, MD  Subjective   No acute events overnight. Patient denied any further episodes of burning throat pain, DOE, dizziness, syncope or LE swelling.  Telemetry reviewed which showed NSR and heart rates are very well controlled, 50 to 60s.  Inpatient Medications    Scheduled Meds:  aspirin EC  81 mg Oral Daily   fluticasone furoate-vilanterol  1 puff Inhalation Daily   metoprolol succinate  100 mg Oral Daily   pantoprazole  40 mg Oral Daily   simvastatin  20 mg Oral [id number]   Continuous Infusions:  heparin 1,300 Units/hr (09/19/22 0108)   PRN Meds: acetaminophen, albuterol, ondansetron (ZOFRAN) IV   Vital Signs    Vitals:   09/19/22 0028 09/19/22 0459 09/19/22 0724 09/19/22 0745  BP: (!) 112/46 120/66  118/66  Pulse: (!) 56 66  68  Resp: '18 18  18  '$ Temp: 98.8 F (37.1 C) 98.8 F (37.1 C)  98.5 F (36.9 C)  TempSrc: Oral Oral  Oral  SpO2: 99% 94% 97% 98%  Weight:  96.2 kg    Height:        Intake/Output Summary (Last 24 hours) at 09/19/2022 1154 Last data filed at 09/19/2022 0600 Gross per 24 hour  Intake 317.78 ml  Output --  Net 317.78 ml   Filed Weights   09/18/22 0810 09/18/22 1731 09/19/22 0459  Weight: 96.2 kg 96.4 kg 96.2 kg    Telemetry     Personally reviewed, NSR and HR controlled at 50-60s.  ECG    An ECG dated 09/18/22 was personally reviewed today and demonstrated:  Atrial fibrillation with RVR, right bundle branch block, left posterior fascicular block.  Physical Exam   GEN: No acute distress.   Neck: No JVD. Cardiac: RRR, ESM Respiratory: Nonlabored. Clear to auscultation bilaterally. GI: Soft, nontender, bowel sounds present. MS: No edema; No deformity. Neuro:  Nonfocal. Psych: Alert and oriented x 3. Normal affect.  Labs    Chemistry Recent Labs  Lab 09/18/22 0107 09/19/22 0104  NA 143 138  K 3.5 3.8  CL 107 103   CO2 27 25  GLUCOSE 129* 112*  BUN 15 21  CREATININE 1.42* 1.58*  CALCIUM 9.7 9.2  PROT 8.1  --   ALBUMIN 4.0  --   AST 18  --   ALT 19  --   ALKPHOS 71  --   BILITOT 1.2  --   GFRNONAA 52* 45*  ANIONGAP 9 10     Hematology Recent Labs  Lab 09/18/22 0107 09/19/22 0104  WBC 11.2* 8.9  RBC 4.50 3.81*  HGB 13.5 11.2*  HCT 41.0 33.9*  MCV 91.1 89.0  MCH 30.0 29.4  MCHC 32.9 33.0  RDW 12.9 12.8  PLT 205 148*    Cardiac Enzymes Recent Labs  Lab 09/18/22 0340 09/18/22 0605  TROPONINIHS 77* 172*    BNP Recent Labs  Lab 09/18/22 0719  BNP 60.0     DDimerNo results for input(s): "DDIMER" in the last 168 hours.   Radiology    DG Chest Portable 1 View  Result Date: 09/18/2022 CLINICAL DATA:  Encounter for atrial fibrillation. EXAM: PORTABLE CHEST 1 VIEW COMPARISON:  PA Lat 10/29/2015 FINDINGS: The heart size and mediastinal contours are within normal limits. Both lungs are clear. The visualized skeletal structures are unremarkable. IMPRESSION: No active disease. Electronically Signed   By: Telford Nab M.D.   On: 09/18/2022  01:21    Cardiac Studies  Echo on 09/19/22 Echo showed normal LVEF and mild AS   Assessment & Plan   Patient is a 76 old male known to have COPD, CAD s/p PCI/BMS to OM in 1998 and DES to OM in 2003 with repeat negative stress tests, HTN, HLD presented to hospital with new onset A-fib with RVR.   #Paroxysmal A-fib, CV score more than 2 PLAN Rate control: Continue metoprolol succinate 100 mg once daily. Telemetry showed NSR and HR maintained 50 to 60s.  Rhythm control: Patient converted to NSR Systemic anticoagulation: Switch heparin to Eliquis 5 mg twice daily.  CV score is more than 2. Risk factor modification: HTN, CAD, outpatient OSA evaluation.  #CAD s/p PCI/BMS to OM in 1998 and DES to OM in 2003 with repeat negative stress tests PLAN -No indication of aspirin 81 mg . Stop aspirin. -Continue simvastatin 20 mg nightly, patient 76  years old.  Goal LDL less than 70. -Echocardiogram performed during this admission showed normal LVEF and mild aortic valve stenosis upon review of images. Waiting for official report.  Patient will be scheduled for outpatient stress test and follow-up with his primary cardiologist, Dr. Ellyn Hack in 1 month posthospitalization.  #HTN, controlled PLAN -Continue metoprolol succinate 100 mg once daily.  Patient stated that his blood pressures were always controlled at home.  Discharge to home today.  I have spent a total of 37 minutes with patient reviewing chart , telemetry, EKGs, labs and examining patient as well as establishing an assessment and plan that was discussed with the patient.  > 50% of time was spent in direct patient care.    Signed, Chalmers Guest, MD  09/19/2022, 11:54 AM

## 2022-09-21 ENCOUNTER — Telehealth: Payer: Self-pay | Admitting: Cardiology

## 2022-09-21 NOTE — Telephone Encounter (Signed)
Spoke to patient letter stating Dr.Harding evaluated pt 10/21 at [facility] ED and ordered a Leane Call faxed to [facility] at fax # [phone].

## 2022-09-21 NOTE — Telephone Encounter (Signed)
Patient's wife states the [place] needs a letter discussing necessity of 10/26 stress test faxed to [phone] (attn: Ulice Dash) in order to cover the expense of  it.  Patient would like a call back to confirm.

## 2022-09-22 ENCOUNTER — Telehealth: Payer: Self-pay | Admitting: Cardiology

## 2022-09-22 ENCOUNTER — Telehealth (HOSPITAL_COMMUNITY): Payer: Self-pay

## 2022-09-22 NOTE — Telephone Encounter (Signed)
*  STAT* If patient is at the pharmacy, call can be transferred to refill team.   1. Which medications need to be refilled? (please list name of each medication and dose if known) apixaban (ELIQUIS) 5 MG TABS tablet  metoprolol succinate (TOPROL-XL) 100 MG 24 hr tablet  2. Which pharmacy/location (including street and city if local pharmacy) is medication to be sent to? [place], [place] - [address]  3. Do they need a 30 day or 90 day supply? [place]

## 2022-09-22 NOTE — Telephone Encounter (Signed)
Spoke with the patient, detailed instructions given. He stated that he would be here for his test. Asked to call back with ay questions.  S.Emalyn Schou EMTP

## 2022-09-23 ENCOUNTER — Ambulatory Visit
Admission: RE | Admit: 2022-09-23 | Discharge: 2022-09-23 | Disposition: A | Discharge status: Home / Self Care | Payer: Medicare PPO | Source: Ambulatory Visit | Attending: Internal Medicine | Admitting: Internal Medicine

## 2022-09-23 DIAGNOSIS — K769 Liver disease, unspecified: Secondary | ICD-10-CM | POA: Diagnosis present

## 2022-09-23 NOTE — Telephone Encounter (Signed)
Patient was just recently in the hospital on 09/18/22 for afib with RVR. Patient was seen by Dr Ellyn Hack at that time He will be following up with Jesse,NP in Nov 2023

## 2022-09-23 NOTE — Telephone Encounter (Signed)
Had televisit with Rosaria Ferries on 02/06/20. Last televisit with you on 06/16/19.    Refill?

## 2022-09-24 ENCOUNTER — Ambulatory Visit (HOSPITAL_COMMUNITY): Payer: Medicare PPO | Attending: Cardiology

## 2022-09-24 DIAGNOSIS — R072 Precordial pain: Secondary | ICD-10-CM | POA: Insufficient documentation

## 2022-09-24 LAB — MYOCARDIAL PERFUSION IMAGING
LV dias vol: 103 mL (ref 62–150)
LV sys vol: 44 mL
Nuc Stress EF: 57 %
Peak HR: 65 {beats}/min
Rest HR: 52 {beats}/min
Rest Nuclear Isotope Dose: 10.2 mCi
SDS: 0
SRS: 0
SSS: 0
ST Depression (mm): 0 mm
Stress Nuclear Isotope Dose: 31.8 mCi
TID: 1

## 2022-09-24 MED ORDER — TECHNETIUM TC 99M TETROFOSMIN IV KIT
31.8000 | PACK | Freq: Once | INTRAVENOUS | Status: AC | PRN
  Administered 2022-09-24: 31.8 via INTRAVENOUS

## 2022-09-24 MED ORDER — APIXABAN 5 MG PO TABS: 5.0000 mg | ORAL_TABLET | Freq: Two times a day (BID) | ORAL | 2 refills | Status: DC

## 2022-09-24 MED ORDER — METOPROLOL SUCCINATE ER 100 MG PO TB24: 100.0000 mg | ORAL_TABLET | Freq: Every day | ORAL | 2 refills | Status: DC

## 2022-09-24 MED ORDER — REGADENOSON 0.4 MG/5ML IV SOLN
0.4000 mg | Freq: Once | INTRAVENOUS | Status: AC
  Administered 2022-09-24: 0.4 mg via INTRAVENOUS

## 2022-09-24 MED ORDER — TECHNETIUM TC 99M SESTAMIBI GENERIC - CARDIOLITE
10.2000 | Freq: Once | INTRAVENOUS | Status: AC | PRN
  Administered 2022-09-24: 10.2 via INTRAVENOUS

## 2022-09-24 NOTE — Telephone Encounter (Signed)
Medication refilled to [facility] clinic

## 2022-09-24 NOTE — Telephone Encounter (Signed)
Yes needs refill [facility]

## 2022-10-25 NOTE — Progress Notes (Signed)
Cardiology Clinic Note   Patient Name: Brad Owen Date of Encounter: 10/26/2022  Primary Care Provider:  [facility] Primary Cardiologist:  Glenetta Hew, MD  Patient Profile    Brad Owen 76 year old male presents to the clinic today for follow-up evaluation of his coronary artery disease and essential hypertension.  Past Medical History    Past Medical History:  Diagnosis Date   Asthma    CAD S/P percutaneous coronary angioplasty 1998   BMS to OM, followed by DES LAD in 2003: PTCA of a  side branch.; Patent stents in 2005.   CKD (chronic kidney disease) stage 3, GFR 30-59 ml/min (HCC) 09/19/2022   COPD (chronic obstructive pulmonary disease) (HCC)    Erectile dysfunction    GERD (gastroesophageal reflux disease)    History of stress test 01/05/2004   abnormal stress test, positive bruce protocol exercise stress test with scintigraphic evidence of mild lateral wall ischemia at the apex. Dynamic gating reveals a preserved EF at 60% by QGS   Hypercholesteremia    Hypertension    Psoriasis    Patchy psoriasis   Psoriatic arthritis ([place])    hips   Past Surgical History:  Procedure Laterality Date   CARDIAC CATHETERIZATION  03/1998   Normal left ventricular systolic function, Minor narrowing in the body of the stent, minor tapering in the distal RCA at the area of the PDA.   CARDIAC CATHETERIZATION  02.2005   Normal left ventricular systolic function, patent stents in both the circumflex and left anterior descending with only minimal in-stent restenosis in the left anterior descending.   CHOLECYSTECTOMY  06/05/2014   CORONARY ANGIOPLASTY  12/06/2001   OM: cutting balloon PTCA   CORONARY STENT PLACEMENT  06/06/1997   90% Prox LAD - 3.0 x 18 mm AVE (GFX) BMS stent    CORONARY STENT PLACEMENT  05/10/2002   LCx: Cypher DES 3. x 18, rescue Cutting Balloon PTCA of jailed Ostial OM    NM MYOVIEW LTD  09/16; 10/'18   a) Normal, low risk study. EF 55-60%.; b)  LOW RISK . EF 57% with no RWMA.  No ischemia or infarction.   NM MYOVIEW LTD  01/24/2019   [facility]: EF 67%.  No reversible ischemia.  No infarct.    Allergies  No Known Allergies  History of Present Illness    Brad Owen has a PMH of CKD stage III, elevated TSH, essential hypertension, HLD, HTN, CAD status post PCTA, and atrial fibrillation with RVR.  He is status post cardiac catheterization with BMS to his OM in 1998 and DES to his OM 2003.  His PMH also includes COPD.  He presented to the emergency department on 09/18/2022 and was discharged on 09/19/2022.  He complained of intermittent chest burning and palpitations as well as dyspnea on exertion.  His symptoms have been present for several weeks.  He did note a sudden worsening of his symptoms on the day of admission as well as associated elevated blood pressure with systolics in the 756E.  He took sublingual nitroglycerin x 3 without relief.  EMS was contacted.  He was noted to be in new onset atrial fibrillation with RVR.  In the emergency department he spontaneously converted to NSR.  He was noted to have a right bundle branch block, LPFB while in atrial fibrillation which resolved with spontaneous conversion to NSR.  He was started on IV heparin and his home metoprolol dose was increased to 100 mg daily.  He was transitioned to Eliquis and remained in sinus rhythm at discharge.  He presents to the clinic today for follow-up evaluation states he feels well today.  He does note that he had 1 episode of higher blood pressure in the 150s over 80s.  He was vacuuming and felt funny.  He sat down and rested for around 2 hours and the symptoms dissipated.  He noted a pounding type heart rate.  He denied irregular or extra heartbeats.  We reviewed his recent ED visit and he expressed understanding.  He reports compliance with his Eliquis medication and denies bleeding issues.  His EKG today shows sinus rhythm 63 bpm no ectopy.  Will plan  follow-up in 3 to 4 months.  I have asked him to increase his physical activity as tolerated.  Today he denies chest pain, shortness of breath, lower extremity edema, fatigue, palpitations, melena, hematuria, hemoptysis, diaphoresis, weakness, presyncope, syncope, orthopnea, and PND.     Home Medications    Prior to Admission medications   Medication Sig Start Date End Date Taking? Authorizing Provider  Adalimumab (HUMIRA PEN) 40 MG/0.8ML PNKT Inject 40 mg into the skin every 14 (fourteen) days.    [provider]  albuterol (PROVENTIL HFA;VENTOLIN HFA) 108 (90 BASE) MCG/ACT inhaler Inhale 2 puffs into the lungs every 6 (six) hours as needed for wheezing or shortness of breath. Patient taking differently: Inhale 2 puffs into the lungs daily as needed for wheezing or shortness of breath. 06/18/15   Marshall, Modena Nunnery, MD  apixaban (ELIQUIS) 5 MG TABS tablet Take 1 tablet (5 mg total) by mouth 2 (two) times daily. 09/24/22 09/24/23  Leonie Man, MD  budesonide-formoterol [facility]) 160-4.5 MCG/ACT inhaler Inhale 2 puffs into the lungs 2 (two) times daily. 11/11/15   Susy Frizzle, MD  fluticasone (FLONASE) 50 MCG/ACT nasal spray PLACE 2 SPRAYS INTO BOTH NOSTRILS DAILY AS NEEDED FOR ALLERGY CONGESTION Patient taking differently: Place 2 sprays into both nostrils daily as needed for allergies. 05/26/18   Susy Frizzle, MD  ibuprofen (ADVIL) 800 MG tablet Take 800 mg by mouth 2 (two) times daily as needed for mild pain. 05/27/22   [provider]  methocarbamol (ROBAXIN) 500 MG tablet Take 500 mg by mouth 2 (two) times daily. Patient not taking: Reported on 09/18/2022 05/27/22   [provider]  metoprolol succinate (TOPROL-XL) 100 MG 24 hr tablet Take 1 tablet (100 mg total) by mouth daily. Take with or immediately following a meal. 09/24/22 09/24/23  Leonie Man, MD  Multiple Vitamins-Minerals (PRESERVISION AREDS 2+MULTI VIT PO) Take 1 tablet by mouth  daily.    [provider]  nitroGLYCERIN (NITROSTAT) 0.4 MG SL tablet Place 1 tablet (0.4 mg total) under the tongue every 5 (five) minutes as needed for chest pain. 02/06/20   Barrett, Evelene Croon, PA-C  pantoprazole (PROTONIX) 40 MG tablet Take 1 tablet (40 mg total) by mouth daily. 02/06/20   Barrett, Evelene Croon, PA-C  Propylene Glycol (SYSTANE BALANCE) 0.6 % SOLN Place 1 drop into both eyes daily as needed (dry eyes).    [provider]  simvastatin (ZOCOR) 20 MG tablet Take 1 tablet (20 mg total) by mouth every morning. 02/06/20   Barrett, Evelene Croon, PA-C    Family History    Family History  Problem Relation Age of Onset   Heart Problems Mother    Diabetes Father    Heart Problems Father    Diabetes Sister    He indicated  that his mother is alive. He indicated that his father is deceased. He indicated that the status of his sister is unknown.  Social History    Social History   Socioeconomic History   Marital status: Married    Spouse name: Not on file   Number of children: Not on file   Years of education: Not on file   Highest education level: Not on file  Occupational History   Not on file  Tobacco Use   Smoking status: Former   Smokeless tobacco: Current    Types: Chew  Substance and Sexual Activity   Alcohol use: Yes    Alcohol/week: 0.0 standard drinks of alcohol    Comment: occassionally   Drug use: No   Sexual activity: Not on file  Other Topics Concern   Not on file  Social History Narrative   He is a married father of 2. He has 3 stepchildren, he has 10 grandchildren and 2 great   Grandchildren. He chews tobacco, does not   smoke. He seldom drinks alcohol.    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 He walks well over the golf course without any significant difficulties. Besides this he really does get routine exercise.    Social  Determinants of Health   Financial Resource Strain: Not on file  Food Insecurity: Not on file  Transportation Needs: Not on file  Physical Activity: Not on file  Stress: Not on file  Social Connections: Not on file  Intimate Partner Violence: Not on file     Review of Systems    General:  No chills, fever, night sweats or weight changes.  Cardiovascular:  No chest pain, dyspnea on exertion, edema, orthopnea, palpitations, paroxysmal nocturnal dyspnea. Dermatological: No rash, lesions/masses Respiratory: No cough, dyspnea Urologic: No hematuria, dysuria Abdominal:   No nausea, vomiting, diarrhea, bright red blood per rectum, melena, or hematemesis Neurologic:  No visual changes, wkns, changes in mental status. All other systems reviewed and are otherwise negative except as noted above.  Physical Exam    VS:  BP 136/62   Ht '5\' 10"'$  (1.778 m)   Wt 219 lb (99.3 kg)   SpO2 96%   BMI 31.42 kg/m  , BMI Body mass index is 31.42 kg/m. GEN: Well nourished, well developed, in no acute distress. HEENT: normal. Neck: Supple, no JVD, carotid bruits, or masses. Cardiac: RRR, 3/6 systolic murmurs head along Right sternal border, rubs, or gallops. No clubbing, cyanosis, edema.  Radials/DP/PT 2+ and equal bilaterally.  Respiratory:  Respirations regular and unlabored, clear to auscultation bilaterally. GI: Soft, nontender, nondistended, BS + x 4. MS: no deformity or atrophy. Skin: warm and dry, no rash. Neuro:  Strength and sensation are intact. Psych: Normal affect.  Accessory Clinical Findings    Recent Labs: 09/18/2022: ALT 19; B Natriuretic Peptide 60.0; Magnesium 2.0; TSH 6.216 09/19/2022: BUN 21; Creatinine, Ser 1.58; Hemoglobin 11.2; Platelets 148; Potassium 3.8; Sodium 138   Recent Lipid Panel    Component Value Date/Time   CHOL 126 09/19/2022 0104   TRIG 84 09/19/2022 0104   HDL 39 (L) 09/19/2022 0104   CHOLHDL 3.2 09/19/2022 0104   VLDL 17 09/19/2022 0104   LDLCALC 70  09/19/2022 0104         ECG personally reviewed by me today-sinus rhythm 63 bpm no ectopy- No acute changes  Echocardiogram  09/19/2022 IMPRESSIONS     1. Left ventricular ejection fraction, by estimation, is 60 to 65%. The  left ventricle has normal function. The left ventricle has no regional  wall motion abnormalities. Left ventricular diastolic parameters were  normal.   2. Right ventricular systolic function is normal. The right ventricular  size is normal.   3. The mitral valve is grossly normal. No evidence of mitral valve  regurgitation. No evidence of mitral stenosis.   4. The aortic valve is calcified. Aortic valve regurgitation is not  visualized. Mild aortic valve stenosis.   5. The inferior vena cava is normal in size with greater than 50%  respiratory variability, suggesting right atrial pressure of 3 mmHg.   FINDINGS   Left Ventricle: Left ventricular ejection fraction, by estimation, is 60  to 65%. The left ventricle has normal function. The left ventricle has no  regional wall motion abnormalities. The left ventricular internal cavity  size was normal in size. There is   no left ventricular hypertrophy. Left ventricular diastolic parameters  were normal.   Right Ventricle: The right ventricular size is normal. No increase in  right ventricular wall thickness. Right ventricular systolic function is  normal.   Left Atrium: Left atrial size was normal in size.   Right Atrium: Right atrial size was normal in size.   Pericardium: There is no evidence of pericardial effusion.   Mitral Valve: The mitral valve is grossly normal. No evidence of mitral  valve regurgitation. No evidence of mitral valve stenosis.   Tricuspid Valve: The tricuspid valve is grossly normal. Tricuspid valve  regurgitation is trivial. No evidence of tricuspid stenosis.   Aortic Valve: The aortic valve is calcified. Aortic valve regurgitation is  not visualized. Mild aortic stenosis is  present. Aortic valve mean  gradient measures 11.0 mmHg. Aortic valve peak gradient measures 20.6  mmHg. Aortic valve area, by VTI measures  1.67 cm.   Pulmonic Valve: The pulmonic valve was not well visualized. Pulmonic valve  regurgitation is not visualized. No evidence of pulmonic stenosis.   Aorta: The aortic root is normal in size and structure and the ascending  aorta was not well visualized.   Venous: The inferior vena cava is normal in size with greater than 50%  respiratory variability, suggesting right atrial pressure of 3 mmHg.   IAS/Shunts: No atrial level shunt detected by color flow Doppler.    Assessment & Plan   1.  Paroxysmal atrial fibrillation-heart rate today 63.  Denies recent episodes of irregular or accelerated heart rate.  Presented to the hospital 09/18/2022 and was noted to be in atrial fibrillation with RVR.  He spontaneously converted and was placed on Eliquis twice daily at that time. Continue metoprolol, apixaban Heart healthy low-sodium diet Increase physical activity as tolerated Avoid triggers caffeine, chocolate, EtOH, dehydration etc.  This patients CHA2DS2-VASc Score and unadjusted Ischemic Stroke Rate (% per year) is equal to 4.8 % stroke rate/year from a score of 4 [ HTN,  Vascular=MI/PAD/Aortic Plaque,  [Age > 75- 2pts   Essential hypertension-BP today 136/62 Continue current medical therapy Maintain blood pressure log  Coronary artery disease-denies recent episodes of arm neck back or chest discomfort.  During recent admission troponins were elevated at 77 increased to 172.  Echocardiogram at that time showed normal LVEF and mild aortic stenosis.  It was felt that his elevated troponins were not related to his atrial fibrillation with RVR.  Outpatient stress testing was recommended. Order nuclear stress test  Hyperlipidemia-reports compliance with simvastatin Heart healthy low-sodium high-fiber diet Increase physical activity as  tolerated Continue simvastatin  CKD stage III-baseline creatinine around 1.50. Follows with PCP  Disposition: Follow-up with Dr. Ellyn Hack in 3-4 months.   Jossie Ng. Silver Parkey NP-C     10/26/2022, 10:42 AM [facility] [address] Office [phone] Fax [phone]    I spent 14 minutes examining this patient, reviewing medications, and using patient centered shared decision making involving her cardiac care.  Prior to her visit I spent greater than 20 minutes reviewing her past medical history,  medications, and prior cardiac tests.

## 2022-10-26 ENCOUNTER — Ambulatory Visit: Payer: No Typology Code available for payment source | Attending: General Practice | Admitting: General Practice

## 2022-10-26 ENCOUNTER — Encounter: Payer: Self-pay | Admitting: General Practice

## 2022-10-26 VITALS — BP 136/62 | Ht 70.0 in | Wt 219.0 lb

## 2022-10-26 DIAGNOSIS — E782 Mixed hyperlipidemia: Secondary | ICD-10-CM | POA: Diagnosis not present

## 2022-10-26 DIAGNOSIS — Z9861 Coronary angioplasty status: Secondary | ICD-10-CM

## 2022-10-26 DIAGNOSIS — I10 Essential (primary) hypertension: Secondary | ICD-10-CM

## 2022-10-26 DIAGNOSIS — I480 Paroxysmal atrial fibrillation: Secondary | ICD-10-CM

## 2022-10-26 DIAGNOSIS — I2510 Atherosclerotic heart disease of native coronary artery without angina pectoris: Secondary | ICD-10-CM

## 2022-10-26 DIAGNOSIS — N183 Chronic kidney disease, stage 3 unspecified: Secondary | ICD-10-CM

## 2022-10-26 MED ORDER — METOPROLOL SUCCINATE ER 100 MG PO TB24: 100.0000 mg | ORAL_TABLET | Freq: Every day | ORAL | 3 refills | Status: AC

## 2022-10-26 MED ORDER — APIXABAN 5 MG PO TABS: 5.0000 mg | ORAL_TABLET | Freq: Two times a day (BID) | ORAL | 3 refills | Status: AC

## 2022-10-26 MED ORDER — NITROGLYCERIN 0.4 MG SL SUBL: 0.4000 mg | SUBLINGUAL_TABLET | SUBLINGUAL | 3 refills | Status: AC | PRN

## 2022-10-26 NOTE — Patient Instructions (Signed)
Medication Instructions:  The current medical regimen is effective;  continue present plan and medications as directed. Please refer to the Current Medication list given to you today.  *If you need a refill on your cardiac medications before your next appointment, please call your pharmacy*   Lab Work: NONE If you have labs (blood work) drawn today and your tests are completely normal, you will receive your results only by:  [place] (if you have MyChart) OR  A paper copy in the mail If you have any lab test that is abnormal or we need to change your treatment, we will call you to review the results.  Testing/Procedures: NONE  Follow-Up: At [facility], you and your health needs are our priority.  As part of our continuing mission to provide you with exceptional heart care, we have created designated Provider Care Teams.  These Care Teams include your primary Cardiologist (physician) and Advanced Practice Providers (APPs -  Physician Assistants and Nurse Practitioners) who all work together to provide you with the care you need, when you need it.  Your next appointment:   3 month(s)  The format for your next appointment:   In Person  Provider:   Glenetta Hew, MD  or Coletta Memos, FNP        Other Instructions INCREASE PHYSICAL ACTIVITY AS TOLERATED  Please try to avoid these triggers: Do not use any products that have nicotine or tobacco in them. These include cigarettes, e-cigarettes, and chewing tobacco. If you need help quitting, ask your doctor. Eat heart-healthy foods. Talk with your doctor about the right eating plan for you. Exercise regularly as told by your doctor. Stay hydrated Do not drink alcohol, Caffeine or chocolate. Lose weight if you are overweight. Do not use drugs, including cannabis     Important Information About Sugar

## 2023-01-21 NOTE — Progress Notes (Signed)
Cardiology Office Note:    Date:  01/25/2023   ID:  Brad Owen, DOB [birth year], MRN [id number]  PCP:  [facility] [place] [facility] Cardiologist:  Glenetta Hew, MD {   Referring MD: [facility]   Chief Complaint  Patient presents with   Follow-up    Atrial fibrillation  CAD     History of Present Illness:    Brad Owen is a 77 y.o. male with a hx of HTN, HLD, CAD, atrial fibrillation, CKD stage III. Patient is followed by Dr. Percival Spanish and presents today for follow up of his atrial fibrillation.   Per chart review, patient has a history of CAD and had a BMS to his LAD in 1998. Had recurrent angina in January 2003 and had Cutting Balloon placed to a bifurcation lesion in his OM system.  In 04/2029, he had DES placed to the OM. Cardiac catheterization in 2005 showed patent stents in the circumflex and LAD with only minimal in-stent restenosis in the LAD. More recently had a nuclear stress test in 2020 that showed no evidence of inducible ischemia.   Patient was admitted from 10/20 - 09/19/22 for treatment of new onset paroxysmal atrial fibrillation with RVR, chest pain, and elevated troponin. Echocardiogram from 09/19/22 showed EF 60-65%, no regional wall motion abnormalities, normal diastolic parameters, normal RV systolic function. He was discharged on metoprolol succinate 100 mg daily and eliquis 5 mg BID. Had a nuclear stress test on 09/24/22 that showed no evidence of ischemia or infarction. EF was 57%.   Patient was last seen by cardiology on 10/26/22. At that time, patient was doing well and he denied having recent episodes of irregular or accelerated heart rate.  Today, patient reports that he has been doing okay from a cardiac standpoint.  He has had some episodes of racing heartbeat.  These episodes happen about once a week to once every couple of weeks. Episodes occur randomly, last about 15 minutes at a time and go away on their  own.  Patient reports "feeling bad" when these episodes happen.  In addition to palpitations, he feels burning in his chest.  When these episodes occur, he does take his vital signs and notes that both his blood pressure and heart rate are elevated.  He is unsure exactly what his heart rate gets up to, but he does note that is faster than usual. Palpitations are not associated with dizziness, shortness of breath, syncope, near syncope.  Patient does note some shortness of breath on exertion, he walks about 200 feet to and from his mailbox and gets short of breath on this walk.  He denies having shortness of breath when walking around his home or on minimal exertion.  Does have orthopnea.  Does admit to snoring, but does not think that he has sleep apnea.  Denies exertional chest pain.    Past Medical History:  Diagnosis Date   Asthma    CAD S/P percutaneous coronary angioplasty 1998   BMS to OM, followed by DES LAD in 2003: PTCA of a  side branch.; Patent stents in 2005.   CKD (chronic kidney disease) stage 3, GFR 30-59 ml/min (HCC) 09/19/2022   COPD (chronic obstructive pulmonary disease) (HCC)    Erectile dysfunction    GERD (gastroesophageal reflux disease)    History of stress test 01/05/2004   abnormal stress test, positive bruce protocol exercise stress test with scintigraphic evidence of mild lateral wall ischemia at the apex.  Dynamic gating reveals a preserved EF at 60% by QGS   Hypercholesteremia    Hypertension    Psoriasis    Patchy psoriasis   Psoriatic arthritis ([place])    hips    Past Surgical History:  Procedure Laterality Date   CARDIAC CATHETERIZATION  03/1998   Normal left ventricular systolic function, Minor narrowing in the body of the stent, minor tapering in the distal RCA at the area of the PDA.   CARDIAC CATHETERIZATION  02.2005   Normal left ventricular systolic function, patent stents in both the circumflex and left anterior descending with only minimal in-stent  restenosis in the left anterior descending.   CHOLECYSTECTOMY  06/05/2014   CORONARY ANGIOPLASTY  12/06/2001   OM: cutting balloon PTCA   CORONARY STENT PLACEMENT  06/06/1997   90% Prox LAD - 3.0 x 18 mm AVE (GFX) BMS stent    CORONARY STENT PLACEMENT  05/10/2002   LCx: Cypher DES 3. x 18, rescue Cutting Balloon PTCA of jailed Ostial OM    NM MYOVIEW LTD  09/16; 10/'18   a) Normal, low risk study. EF 55-60%.; b) LOW RISK . EF 57% with no RWMA.  No ischemia or infarction.   NM MYOVIEW LTD  01/24/2019   [facility]: EF 67%.  No reversible ischemia.  No infarct.    Current Medications: Current Meds  Medication Sig   Adalimumab (HUMIRA PEN) 40 MG/0.8ML PNKT Inject 40 mg into the skin every 14 (fourteen) days.   albuterol (PROVENTIL HFA;VENTOLIN HFA) 108 (90 BASE) MCG/ACT inhaler Inhale 2 puffs into the lungs every 6 (six) hours as needed for wheezing or shortness of breath. (Patient taking differently: Inhale 2 puffs into the lungs daily as needed for wheezing or shortness of breath.)   apixaban (ELIQUIS) 5 MG TABS tablet Take 1 tablet (5 mg total) by mouth 2 (two) times daily.   budesonide-formoterol (SYMBICORT) 160-4.5 MCG/ACT inhaler Inhale 2 puffs into the lungs 2 (two) times daily.   fluticasone (FLONASE) 50 MCG/ACT nasal spray PLACE 2 SPRAYS INTO BOTH NOSTRILS DAILY AS NEEDED FOR ALLERGY CONGESTION (Patient taking differently: Place 2 sprays into both nostrils daily as needed for allergies.)   methocarbamol (ROBAXIN) 500 MG tablet Take 500 mg by mouth 2 (two) times daily.   metoprolol succinate (TOPROL-XL) 100 MG 24 hr tablet Take 1 tablet (100 mg total) by mouth daily. Take with or immediately following a meal.   Multiple Vitamins-Minerals (PRESERVISION AREDS 2+MULTI VIT PO) Take 1 tablet by mouth daily.   nitroGLYCERIN (NITROSTAT) 0.4 MG SL tablet Place 1 tablet (0.4 mg total) under the tongue every 5 (five) minutes as needed for chest pain.   pantoprazole (PROTONIX) 40 MG tablet Take 1  tablet (40 mg total) by mouth daily.   Propylene Glycol (SYSTANE BALANCE) 0.6 % SOLN Place 1 drop into both eyes daily as needed (dry eyes).   simvastatin (ZOCOR) 20 MG tablet Take 1 tablet (20 mg total) by mouth every morning.     Allergies:   Patient has no known allergies.   Social History   Socioeconomic History   Marital status: Married    Spouse name: Not on file   Number of children: Not on file   Years of education: Not on file   Highest education level: Not on file  Occupational History   Not on file  Tobacco Use   Smoking status: Former   Smokeless tobacco: Current    Types: Chew  Substance and Sexual Activity   Alcohol  use: Yes    Alcohol/week: 0.0 standard drinks of alcohol    Comment: occassionally   Drug use: No   Sexual activity: Not on file  Other Topics Concern   Not on file  Social History Narrative   He is a married father of 2. He has 3 stepchildren, he has 10 grandchildren and 2 great   Grandchildren. He chews tobacco, does not   smoke. He seldom drinks alcohol.    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 He walks well over the golf course without any significant difficulties. Besides this he really does get routine exercise.    Social Determinants of Health   Financial Resource Strain: Not on file  Food Insecurity: Not on file  Transportation Needs: Not on file  Physical Activity: Not on file  Stress: Not on file  Social Connections: Not on file     Family History: The patient's family history includes Diabetes in his father and sister; Heart Problems in his father and mother.  ROS:   Please see the history of present illness.    All other systems reviewed and are negative.  EKGs/Labs/Other Studies Reviewed:    The following studies were reviewed today:  Nuclear Stress Test 09/24/22    LV perfusion is normal. There is no  evidence of ischemia. There is no evidence of infarction. Apical thinning artifact noted.   Left ventricular function is normal. Nuclear stress EF: 57 %. The left ventricular ejection fraction is normal (55-65%). End diastolic cavity size is normal.   The study is normal. The study is low risk.   No ST deviation was noted.  Echocardiogram 09/19/22  1. Left ventricular ejection fraction, by estimation, is 60 to 65%. The  left ventricle has normal function. The left ventricle has no regional  wall motion abnormalities. Left ventricular diastolic parameters were  normal.   2. Right ventricular systolic function is normal. The right ventricular  size is normal.   3. The mitral valve is grossly normal. No evidence of mitral valve  regurgitation. No evidence of mitral stenosis.   4. The aortic valve is calcified. Aortic valve regurgitation is not  visualized. Mild aortic valve stenosis.   5. The inferior vena cava is normal in size with greater than 50%  respiratory variability, suggesting right atrial pressure of 3 mmHg.    Recent Labs: 09/18/2022: ALT 19; B Natriuretic Peptide 60.0; Magnesium 2.0; TSH 6.216 09/19/2022: BUN 21; Creatinine, Ser 1.58; Hemoglobin 11.2; Platelets 148; Potassium 3.8; Sodium 138  Recent Lipid Panel    Component Value Date/Time   CHOL 126 09/19/2022 0104   TRIG 84 09/19/2022 0104   HDL 39 (L) 09/19/2022 0104   CHOLHDL 3.2 09/19/2022 0104   VLDL 17 09/19/2022 0104   LDLCALC 70 09/19/2022 0104     Risk Assessment/Calculations:    CHA2DS2-VASc Score = 4   This indicates a 4.8% annual risk of stroke. The patient's score is based upon: CHF History: 0 HTN History: 1 Diabetes History: 0 Stroke History: 0 Vascular Disease History: 1 Age Score: 2 Gender Score: 0    STOP-Bang Score:  4       Physical Exam:    VS:  BP 122/68   Pulse 76   Ht '5\' 9"'$  (1.753 m)   Wt 218 lb (98.9 kg)   SpO2 94%   BMI 32.19 kg/m  Wt Readings from Last 3 Encounters:   01/25/23 218 lb (98.9 kg)  10/26/22 219 lb (99.3 kg)  09/24/22 212 lb (96.2 kg)     GEN: Elderly male, sitting comfortably on the exam table in no acute distress  HEENT: Normal NECK: No JVD CARDIAC: RRR, no murmurs, rubs, gallops. Radial pulses 2+ bilaterally  RESPIRATORY:  Clear to auscultation without rales, wheezing or rhonchi. Normal WOB on room air  ABDOMEN: Soft, non-tender, non-distended MUSCULOSKELETAL:  No edema in BLE; No deformity  SKIN: Warm and dry NEUROLOGIC:  Alert and oriented x 3 PSYCHIATRIC:  Normal affect   ASSESSMENT:    1. PAF (paroxysmal atrial fibrillation) ([place])   2. Atrial fibrillation, unspecified type ([place])   3. CAD S/P percutaneous coronary angioplasty   4. Hyperlipidemia with target low density lipoprotein (LDL) cholesterol less than 70 mg/dL   5. Stage 3 chronic kidney disease, unspecified whether stage 3a or 3b CKD ([place])   6. Essential hypertension   7. Mild aortic stenosis   8. Snoring    PLAN:    In order of problems listed above:  Paroxysmal Atrial Fibrillation  - Patient was first diagnosed with atrial fibrillation in 08/2022. Echocardiogram 09/19/22 showed EF 60-65%, normal RV systolic function  - Patient does complain of some episodes of racing heartbeat.  These episodes occur once every week to once every couple of weeks. He notices that his HR is elevated, but he does not recall how fast his HR gets. Episodes last about 20 minutes and resolve on their own.  - Ordered a 2-week monitor to assess A-fib burden - Continue metoprolol succinate 100 mg daily  - Continue eliquis 5 mg BID. Patient reports compliance. Denies bleeding issues. Checking CBC    CAD - Patient had a BMS to his LAD in 1998. Later had DES to OM in 2003 - Most recent ischemic evaluation was a nuclear stress test in 08/2022 that showed no evidence of ischemia or infarction  - Patient denies chest pain on exertion.  Does get short of breath when walking long distances but  denies shortness of breath otherwise - Continue metoprolol succinate 100 mg daily, simvastatin 20 mg daily. Patient is not on ASA as he is on eliquis   HLD  - Goal LDL <70  - Lipid panel from 08/2022 showed LDL 70, HDL 39, triglycerides 84, total cholesterol 126 - Continue simvastatin 20 mg daily  CKD stage III  - Followed by PCP  - Checking BMP today   HTN  - BP well controlled on current regiment   Mild Aortic Valve Stenosis - Noted on echocardiogram from 08/2022 - Will need repeat study in 3 years   Snoring  Orthopnea  - Patient admits that he snores at night. Denies daytime somnolence - STOP-bang score 4  - Patient refuses sleep study at this time - Patient also complains of some orthopnea. He appears euvolemic on exam and lungs were clear to auscultation. Echocardiogram from 08/2022 showed EF 60-65%, normal LV diastolic function  - Checking BNP and BMP   Medication Adjustments/Labs and Tests Ordered: Current medicines are reviewed at length with the patient today.  Concerns regarding medicines are outlined above.  Orders Placed This Encounter  Procedures   Basic metabolic panel   Brain natriuretic peptide   CBC   LONG TERM MONITOR (3-14 DAYS)   No orders of the defined types were placed in this encounter.   Patient Instructions  Medication Instructions:  No Changes In Medications at  this time.  *If you need a refill on your cardiac medications before your next appointment, please call your pharmacy*  Lab Work: BLOOD WORK TODAY  If you have labs (blood work) drawn today and your tests are completely normal, you will receive your results only by: [place] (if you have MyChart) OR A paper copy in the mail If you have any lab test that is abnormal or we need to change your treatment, we will call you to review the results.  Testing/Procedures:  [facility]- Long Term Monitor Instructions   Your physician has requested you wear your ZIO patch  monitor__14_____days.   This is a single patch monitor.  [employer] supplies one patch monitor per enrollment.  Additional stickers are not available.   Please do not apply patch if you will be having a Nuclear Stress Test, Echocardiogram, Cardiac CT, MRI, or Chest Xray during the time frame you would be wearing the monitor. The patch cannot be worn during these tests.  You cannot remove and re-apply the ZIO XT patch monitor.   Your ZIO patch monitor will be sent USPS Priority mail from [facility] directly to your home address. The monitor may also be mailed to a PO BOX if home delivery is not available.   It may take 3-5 days to receive your monitor after you have been enrolled.   Once you have received you monitor, please review enclosed instructions.  Your monitor has already been registered assigning a specific monitor serial # to you.   Applying the monitor   Shave hair from upper left chest.   Hold abrader disc by orange tab.  Rub abrader in 40 strokes over left upper chest as indicated in your monitor instructions.   Clean area with 4 enclosed alcohol pads .  Use all pads to assure are is cleaned thoroughly.  Let dry.   Apply patch as indicated in monitor instructions.  Patch will be place under collarbone on left side of chest with arrow pointing upward.   Rub patch adhesive wings for 2 minutes.Remove white label marked "1".  Remove white label marked "2".  Rub patch adhesive wings for 2 additional minutes.   While looking in a mirror, press and release button in center of patch.  A small green light will flash 3-4 times .  This will be your only indicator the monitor has been turned on.     Do not shower for the first 24 hours.  You may shower after the first 24 hours.   Press button if you feel a symptom. You will hear a small click.  Record Date, Time and Symptom in the Patient Log Book.   When you are ready to remove patch, follow instructions on last 2 pages of Patient  Log Book.  Stick patch monitor onto last page of Patient Log Book.   Place Patient Log Book in [place] box.  Use locking tab on box and tape box closed securely.  The [employer] has [employer] on it.  Please place in mailbox as soon as possible.  Your physician should have your test results approximately 7 days after the monitor has been mailed back to [facility].   Call [place] at [phone] if you have questions regarding your ZIO XT patch monitor.  Call them immediately if you see an orange light blinking on your monitor.   If your monitor falls off in less than 4 days contact our Monitor department at [phone].  If your monitor becomes loose or falls off after 4 days call [employer] at [phone] for suggestions on securing your monitor.    Follow-Up: At [facility], you and your health needs are our priority.  As part of our continuing mission to provide you with exceptional heart care, we have created designated Provider Care Teams.  These Care Teams include your primary Cardiologist (physician) and Advanced Practice Providers (APPs -  Physician Assistants and Nurse Practitioners) who all work together to provide you with the care you need, when you need it.  Your next appointment:   3 week(s)  Provider:   ANY APP   AND THEN FOLLOW UP IN 9 MONTHS WITH Dr. Ellyn Hack    Signed, Margie Billet, PA-C  01/25/2023 10:53 AM    [place]

## 2023-01-25 ENCOUNTER — Ambulatory Visit: Payer: No Typology Code available for payment source | Attending: General Practice | Admitting: Cardiology

## 2023-01-25 ENCOUNTER — Ambulatory Visit: Payer: No Typology Code available for payment source | Attending: Cardiology

## 2023-01-25 ENCOUNTER — Encounter: Payer: Self-pay | Admitting: General Practice

## 2023-01-25 VITALS — BP 122/68 | HR 76 | Ht 69.0 in | Wt 218.0 lb

## 2023-01-25 DIAGNOSIS — I350 Nonrheumatic aortic (valve) stenosis: Secondary | ICD-10-CM

## 2023-01-25 DIAGNOSIS — I4891 Unspecified atrial fibrillation: Secondary | ICD-10-CM

## 2023-01-25 DIAGNOSIS — I2510 Atherosclerotic heart disease of native coronary artery without angina pectoris: Secondary | ICD-10-CM | POA: Diagnosis not present

## 2023-01-25 DIAGNOSIS — I10 Essential (primary) hypertension: Secondary | ICD-10-CM

## 2023-01-25 DIAGNOSIS — Z9861 Coronary angioplasty status: Secondary | ICD-10-CM

## 2023-01-25 DIAGNOSIS — E785 Hyperlipidemia, unspecified: Secondary | ICD-10-CM

## 2023-01-25 DIAGNOSIS — R0683 Snoring: Secondary | ICD-10-CM

## 2023-01-25 DIAGNOSIS — N183 Chronic kidney disease, stage 3 unspecified: Secondary | ICD-10-CM

## 2023-01-25 DIAGNOSIS — I480 Paroxysmal atrial fibrillation: Secondary | ICD-10-CM

## 2023-01-25 NOTE — Progress Notes (Unsigned)
Enrolled for [employer] to mail a ZIO XT long term holter monitor to the patients address on file.   Dr. Ellyn Hack to read.

## 2023-01-25 NOTE — Patient Instructions (Addendum)
Medication Instructions:  No Changes In Medications at this time.  *If you need a refill on your cardiac medications before your next appointment, please call your pharmacy*  Lab Work: BLOOD WORK TODAY  If you have labs (blood work) drawn today and your tests are completely normal, you will receive your results only by: [place] (if you have MyChart) OR A paper copy in the mail If you have any lab test that is abnormal or we need to change your treatment, we will call you to review the results.  Testing/Procedures:  Bryn Gulling- Long Term Monitor Instructions   Your physician has requested you wear your ZIO patch monitor__14_____days.   This is a single patch monitor.  [employer] supplies one patch monitor per enrollment.  Additional stickers are not available.   Please do not apply patch if you will be having a Nuclear Stress Test, Echocardiogram, Cardiac CT, MRI, or Chest Xray during the time frame you would be wearing the monitor. The patch cannot be worn during these tests.  You cannot remove and re-apply the ZIO XT patch monitor.   Your ZIO patch monitor will be sent USPS Priority mail from [facility] directly to your home address. The monitor may also be mailed to a PO BOX if home delivery is not available.   It may take 3-5 days to receive your monitor after you have been enrolled.   Once you have received you monitor, please review enclosed instructions.  Your monitor has already been registered assigning a specific monitor serial # to you.   Applying the monitor   Shave hair from upper left chest.   Hold abrader disc by orange tab.  Rub abrader in 40 strokes over left upper chest as indicated in your monitor instructions.   Clean area with 4 enclosed alcohol pads .  Use all pads to assure are is cleaned thoroughly.  Let dry.   Apply patch as indicated in monitor instructions.  Patch will be place under collarbone on left side of chest with arrow pointing upward.    Rub patch adhesive wings for 2 minutes.Remove white label marked "1".  Remove white label marked "2".  Rub patch adhesive wings for 2 additional minutes.   While looking in a mirror, press and release button in center of patch.  A small green light will flash 3-4 times .  This will be your only indicator the monitor has been turned on.     Do not shower for the first 24 hours.  You may shower after the first 24 hours.   Press button if you feel a symptom. You will hear a small click.  Record Date, Time and Symptom in the Patient Log Book.   When you are ready to remove patch, follow instructions on last 2 pages of Patient Log Book.  Stick patch monitor onto last page of Patient Log Book.   Place Patient Log Book in [place] box.  Use locking tab on box and tape box closed securely.  The [employer] has [employer] on it.  Please place in mailbox as soon as possible.  Your physician should have your test results approximately 7 days after the monitor has been mailed back to [facility].   Call [place] at [phone] if you have questions regarding your ZIO XT patch monitor.  Call them immediately if you see an orange light blinking on your monitor.   If your monitor falls off in less than 4  days contact our Monitor department at [phone].  If your monitor becomes loose or falls off after 4 days call [employer] at [phone] for suggestions on securing your monitor.    Follow-Up: At [facility], you and your health needs are our priority.  As part of our continuing mission to provide you with exceptional heart care, we have created designated Provider Care Teams.  These Care Teams include your primary Cardiologist (physician) and Advanced Practice Providers (APPs -  Physician Assistants and Nurse Practitioners) who all work together to provide you with the care you need, when you need it.  Your next appointment:   3 week(s)  Provider:   ANY APP    AND THEN FOLLOW UP IN 9 MONTHS WITH Dr. Ellyn Hack

## 2023-01-26 LAB — CBC
Hematocrit: 37.9 % (ref 37.5–51.0)
Hemoglobin: 12.6 g/dL — ABNORMAL LOW (ref 13.0–17.7)
MCH: 29.6 pg (ref 26.6–33.0)
MCHC: 33.2 g/dL (ref 31.5–35.7)
MCV: 89 fL (ref 79–97)
Platelets: 194 10*3/uL (ref 150–450)
RBC: 4.25 x10E6/uL (ref 4.14–5.80)
RDW: 12.7 % (ref 11.6–15.4)
WBC: 9 10*3/uL (ref 3.4–10.8)

## 2023-01-26 LAB — BRAIN NATRIURETIC PEPTIDE: BNP: 45.5 pg/mL (ref 0.0–100.0)

## 2023-01-26 LAB — BASIC METABOLIC PANEL
BUN/Creatinine Ratio: 12 (ref 10–24)
BUN: 15 mg/dL (ref 8–27)
CO2: 25 mmol/L (ref 20–29)
Calcium: 9 mg/dL (ref 8.6–10.2)
Chloride: 104 mmol/L (ref 96–106)
Creatinine, Ser: 1.24 mg/dL (ref 0.76–1.27)
Glucose: 107 mg/dL — ABNORMAL HIGH (ref 70–99)
Potassium: 4.2 mmol/L (ref 3.5–5.2)
Sodium: 142 mmol/L (ref 134–144)
eGFR: 60 mL/min/{1.73_m2} (ref >59)

## 2023-01-28 DIAGNOSIS — I480 Paroxysmal atrial fibrillation: Secondary | ICD-10-CM

## 2023-02-12 NOTE — Progress Notes (Signed)
Cardiology Clinic Note   Patient Name: Brad Owen Date of Encounter: 02/15/2023  Primary Care Provider:  [facility] Primary Cardiologist:  Bryan Lemma, MD  Patient Profile    Brad Owen 77 year old male presents to the clinic today for follow-up evaluation of his coronary artery disease and HTN.  Past Medical History    Past Medical History:  Diagnosis Date   Asthma    CAD S/P percutaneous coronary angioplasty 1998   BMS to OM, followed by DES LAD in 2003: PTCA of a  side branch.; Patent stents in 2005.   CKD (chronic kidney disease) stage 3, GFR 30-59 ml/min (HCC) 09/19/2022   COPD (chronic obstructive pulmonary disease) (HCC)    Erectile dysfunction    GERD (gastroesophageal reflux disease)    History of stress test 01/05/2004   abnormal stress test, positive bruce protocol exercise stress test with scintigraphic evidence of mild lateral wall ischemia at the apex. Dynamic gating reveals a preserved EF at 60% by QGS   Hypercholesteremia    Hypertension    Psoriasis    Patchy psoriasis   Psoriatic arthritis (HCC)    hips   Past Surgical History:  Procedure Laterality Date   CARDIAC CATHETERIZATION  03/1998   Normal left ventricular systolic function, Minor narrowing in the body of the stent, minor tapering in the distal RCA at the area of the PDA.   CARDIAC CATHETERIZATION  02.2005   Normal left ventricular systolic function, patent stents in both the circumflex and left anterior descending with only minimal in-stent restenosis in the left anterior descending.   CHOLECYSTECTOMY  06/05/2014   CORONARY ANGIOPLASTY  12/06/2001   OM: cutting balloon PTCA   CORONARY STENT PLACEMENT  06/06/1997   90% Prox LAD - 3.0 x 18 mm AVE (GFX) BMS stent    CORONARY STENT PLACEMENT  05/10/2002   LCx: Cypher DES 3. x 18, rescue Cutting Balloon PTCA of jailed Ostial OM    NM MYOVIEW LTD  09/16; 10/'18   a) Normal, low risk study. EF 55-60%.; b) LOW RISK . EF 57% with  no RWMA.  No ischemia or infarction.   NM MYOVIEW LTD  01/24/2019   [facility]: EF 67%.  No reversible ischemia.  No infarct.    Allergies  No Known Allergies  History of Present Illness    Brad Owen has a PMH of CKD stage III, elevated TSH, essential hypertension, HLD, HTN, CAD status post PCTA, and atrial fibrillation with RVR.  He is status post cardiac catheterization with BMS to his OM in 1998 and DES to his OM 2003.  His PMH also includes COPD.  He presented to the emergency department on 09/18/2022 and was discharged on 09/19/2022.  He complained of intermittent chest burning and palpitations as well as dyspnea on exertion.  His symptoms have been present for several weeks.  He did note a sudden worsening of his symptoms on the day of admission as well as associated elevated blood pressure with systolics in the 180s.  He took sublingual nitroglycerin x 3 without relief.  EMS was contacted.  He was noted to be in new onset atrial fibrillation with RVR.  In the emergency department he spontaneously converted to NSR.  He was noted to have a right bundle branch block, LPFB while in atrial fibrillation which resolved with spontaneous conversion to NSR.  He was started on IV heparin and his home metoprolol dose was increased to 100 mg daily.  He was transitioned to Eliquis and remained in sinus rhythm at discharge.  He presented to the clinic 10/26/2022 for follow-up evaluation stated he felt well .  He did note that he had 1 episode of higher blood pressure in the 150s over 80s.  He was vacuuming and felt funny.  He sat down and rested for around 2 hours and the symptoms dissipated.  He noted a pounding type heart rate.  He denied irregular or extra heartbeats.  We reviewed his recent ED visit and he expressed understanding.  He reported compliance with his Eliquis medication and denied bleeding issues.  His EKG showed sinus rhythm 63 bpm no ectopy.  I planned follow-up in 3 to 4 months.  I  have asked him to increase his physical activity as tolerated.  He was seen in follow-up by Robet Leu, PA-C on 01/25/2023.  He felt that he was doing okay from a cardiac standpoint at that time.  He did note some episodes of racing heartbeat.  He reported feeling bad when the episodes would happen.  He denied dizziness with his palpitations, shortness of breath, syncope and near syncope.  He did note shortness of breath with walking about 200 feet.  He was noted to have orthopnea.  He denied exertional chest pain.  His BNP was unremarkable.  His BMP and CBC were reassuring.  His cardiac event monitor has not yet resulted.  He presents to the clinic today for follow-up evaluation and states he feels okay today.  He did notice an episode of palpitations that lasted for 1-2 hours just before he took off his cardiac event monitor.  He removed his cardiac event monitor Thursday.  He also reports an episode of increased heart rate that happened 2 weeks prior to his visit.  We reviewed triggers for palpitations and he expressed understanding.  We reviewed his lab results.  I encouraged him to take an extra half metoprolol for sustained episodes of palpitations.  We will await results of cardiac event monitor and plan follow-up in October/November.  Today he denies chest pain, shortness of breath, lower extremity edema, fatigue, palpitations, melena, hematuria, hemoptysis, diaphoresis, weakness, presyncope, syncope, orthopnea, and PND.     Home Medications    Prior to Admission medications   Medication Sig Start Date End Date Taking? Authorizing Provider  Adalimumab (HUMIRA PEN) 40 MG/0.8ML PNKT Inject 40 mg into the skin every 14 (fourteen) days.    [provider]  albuterol (PROVENTIL HFA;VENTOLIN HFA) 108 (90 BASE) MCG/ACT inhaler Inhale 2 puffs into the lungs every 6 (six) hours as needed for wheezing or shortness of breath. Patient taking differently: Inhale 2 puffs into the lungs daily  as needed for wheezing or shortness of breath. 06/18/15   Spencer, Velna Hatchet, MD  apixaban (ELIQUIS) 5 MG TABS tablet Take 1 tablet (5 mg total) by mouth 2 (two) times daily. 09/24/22 09/24/23  Marykay Lex, MD  budesonide-formoterol [facility]) 160-4.5 MCG/ACT inhaler Inhale 2 puffs into the lungs 2 (two) times daily. 11/11/15   Donita Brooks, MD  fluticasone (FLONASE) 50 MCG/ACT nasal spray PLACE 2 SPRAYS INTO BOTH NOSTRILS DAILY AS NEEDED FOR ALLERGY CONGESTION Patient taking differently: Place 2 sprays into both nostrils daily as needed for allergies. 05/26/18   Donita Brooks, MD  ibuprofen (ADVIL) 800 MG tablet Take 800 mg by mouth 2 (two) times daily as needed for mild pain. 05/27/22   [provider]  methocarbamol (ROBAXIN) 500 MG tablet Take 500 mg  by mouth 2 (two) times daily. Patient not taking: Reported on 09/18/2022 05/27/22   [provider]  metoprolol succinate (TOPROL-XL) 100 MG 24 hr tablet Take 1 tablet (100 mg total) by mouth daily. Take with or immediately following a meal. 09/24/22 09/24/23  Marykay Lex, MD  Multiple Vitamins-Minerals (PRESERVISION AREDS 2+MULTI VIT PO) Take 1 tablet by mouth daily.    [provider]  nitroGLYCERIN (NITROSTAT) 0.4 MG SL tablet Place 1 tablet (0.4 mg total) under the tongue every 5 (five) minutes as needed for chest pain. 02/06/20   Barrett, Joline Salt, PA-C  pantoprazole (PROTONIX) 40 MG tablet Take 1 tablet (40 mg total) by mouth daily. 02/06/20   Barrett, Joline Salt, PA-C  Propylene Glycol (SYSTANE BALANCE) 0.6 % SOLN Place 1 drop into both eyes daily as needed (dry eyes).    [provider]  simvastatin (ZOCOR) 20 MG tablet Take 1 tablet (20 mg total) by mouth every morning. 02/06/20   Barrett, Joline Salt, PA-C    Family History    Family History  Problem Relation Age of Onset   Heart Problems Mother    Diabetes Father    Heart Problems Father    Diabetes Sister    He indicated that his mother is  alive. He indicated that his father is deceased. He indicated that the status of his sister is unknown.  Social History    Social History   Socioeconomic History   Marital status: Married    Spouse name: Not on file   Number of children: Not on file   Years of education: Not on file   Highest education level: Not on file  Occupational History   Not on file  Tobacco Use   Smoking status: Former   Smokeless tobacco: Current    Types: Chew  Substance and Sexual Activity   Alcohol use: Yes    Alcohol/week: 0.0 standard drinks of alcohol    Comment: occassionally   Drug use: No   Sexual activity: Not on file  Other Topics Concern   Not on file  Social History Narrative   He is a married father of 2. He has 3 stepchildren, he has 10 grandchildren and 2 great   Grandchildren. He chews tobacco, does not   smoke. He seldom drinks alcohol.    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 He walks well over the golf course without any significant difficulties. Besides this he really does get routine exercise.    Social Determinants of Health   Financial Resource Strain: Not on file  Food Insecurity: Not on file  Transportation Needs: Not on file  Physical Activity: Not on file  Stress: Not on file  Social Connections: Not on file  Intimate Partner Violence: Not on file     Review of Systems    General:  No chills, fever, night sweats or weight changes.  Cardiovascular:  No chest pain, dyspnea on exertion, edema, orthopnea, palpitations, paroxysmal nocturnal dyspnea. Dermatological: No rash, lesions/masses Respiratory: No cough, dyspnea Urologic: No hematuria, dysuria Abdominal:   No nausea, vomiting, diarrhea, bright red blood per rectum, melena, or hematemesis Neurologic:  No visual changes, wkns, changes in mental status. All other systems reviewed and are  otherwise negative except as noted above.  Physical Exam    VS:  BP 122/62 (BP Location: Left Arm, Patient Position:  Sitting, Cuff Size: Large)   Pulse 62   Ht 5\' 10"  (1.778 m)   Wt 216 lb 3.2 oz (98.1 kg)   SpO2 96%   BMI 31.02 kg/m  , BMI Body mass index is 31.02 kg/m. GEN: Well nourished, well developed, in no acute distress. HEENT: normal. Neck: Supple, no JVD, carotid bruits, or masses. Cardiac: RRR, 3/6 systolic murmurs head along right sternal border, rubs, or gallops. No clubbing, cyanosis, edema.  Radials/DP/PT 2+ and equal bilaterally.  Respiratory:  Respirations regular and unlabored, clear to auscultation bilaterally. GI: Soft, nontender, nondistended, BS + x 4. MS: no deformity or atrophy. Skin: warm and dry, no rash. Neuro:  Strength and sensation are intact. Psych: Normal affect.  Accessory Clinical Findings    Recent Labs: 09/18/2022: ALT 19; Magnesium 2.0; TSH 6.216 01/25/2023: BNP 45.5; BUN 15; Creatinine, Ser 1.24; Hemoglobin 12.6; Platelets 194; Potassium 4.2; Sodium 142   Recent Lipid Panel    Component Value Date/Time   CHOL 126 09/19/2022 0104   TRIG 84 09/19/2022 0104   HDL 39 (L) 09/19/2022 0104   CHOLHDL 3.2 09/19/2022 0104   VLDL 17 09/19/2022 0104   LDLCALC 70 09/19/2022 0104         ECG personally reviewed by me today-none today.  EKG 10/26/2022 sinus rhythm 63 bpm no ectopy- No acute changes  Echocardiogram 09/19/2022 IMPRESSIONS     1. Left ventricular ejection fraction, by estimation, is 60 to 65%. The  left ventricle has normal function. The left ventricle has no regional  wall motion abnormalities. Left ventricular diastolic parameters were  normal.   2. Right ventricular systolic function is normal. The right ventricular  size is normal.   3. The mitral valve is grossly normal. No evidence of mitral valve  regurgitation. No evidence of mitral stenosis.   4. The aortic valve is calcified. Aortic valve regurgitation is not   visualized. Mild aortic valve stenosis.   5. The inferior vena cava is normal in size with greater than 50%  respiratory variability, suggesting right atrial pressure of 3 mmHg.   FINDINGS   Left Ventricle: Left ventricular ejection fraction, by estimation, is 60  to 65%. The left ventricle has normal function. The left ventricle has no  regional wall motion abnormalities. The left ventricular internal cavity  size was normal in size. There is   no left ventricular hypertrophy. Left ventricular diastolic parameters  were normal.   Right Ventricle: The right ventricular size is normal. No increase in  right ventricular wall thickness. Right ventricular systolic function is  normal.   Left Atrium: Left atrial size was normal in size.   Right Atrium: Right atrial size was normal in size.   Pericardium: There is no evidence of pericardial effusion.   Mitral Valve: The mitral valve is grossly normal. No evidence of mitral  valve regurgitation. No evidence of mitral valve stenosis.   Tricuspid Valve: The tricuspid valve is grossly normal. Tricuspid valve  regurgitation is trivial. No evidence of tricuspid stenosis.   Aortic Valve: The aortic valve is calcified. Aortic valve regurgitation is  not visualized. Mild aortic stenosis is present. Aortic valve mean  gradient measures 11.0 mmHg. Aortic valve peak gradient measures 20.6  mmHg. Aortic valve area, by VTI measures  1.67 cm.   Pulmonic Valve: The pulmonic valve was not well visualized. Pulmonic valve  regurgitation is not visualized. No evidence of pulmonic stenosis.   Aorta: The aortic root is normal in size and structure and the  ascending  aorta was not well visualized.   Venous: The inferior vena cava is normal in size with greater than 50%  respiratory variability, suggesting right atrial pressure of 3 mmHg.   IAS/Shunts: No atrial level shunt detected by color flow Doppler.    Assessment & Plan   1.  Paroxysmal  atrial fibrillation-heart rate today 62 bpm.  Awaiting results of cardiac event monitor.  Recently seen in the clinic and noted that he did not feel well with episodes of palpitations.  Compliant with apixaban and denies bleeding issues.  CBC BMP BNP all reassuring. Continue metoprolol, apixaban Heart healthy low-sodium diet Increase physical activity as tolerated Avoid triggers caffeine, chocolate, EtOH, dehydration etc.  This patients CHA2DS2-VASc Score and unadjusted Ischemic Stroke Rate (% per year) is equal to 4.8 % stroke rate/year from a score of 4 [ HTN,  Vascular=MI/PAD/Aortic Plaque,  [Age > 75- 2pts  Coronary artery disease-denies recent episodes of arm neck back or chest discomfort.  During recent admission troponins were elevated at 77 increased to 172.  Echocardiogram at that time showed normal LVEF and mild aortic stenosis.  It was felt that his elevated troponins were not related to his atrial fibrillation with RVR.  Outpatient stress testing 09/24/2022 showed normal perfusion and no evidence of ischemia.  EF was noted to be 57%.  Low risk study. Continue current medical therapy  Essential hypertension-BP today 122/62 Continue current medical therapy Maintain blood pressure log   Hyperlipidemia-compliant with statin therapy Heart healthy low-sodium high-fiber diet-reviewed Increase physical activity as tolerated Continue simvastatin  CKD stage III-creatinine 1.24 on 01/25/2023. Follows with PCP  Disposition: Follow-up with Dr. Herbie Baltimore in 4-6 months.   Thomasene Ripple. Marlyss Cissell NP-C     02/15/2023, 3:14 PM [facility] [address] Office [phone] Fax [phone]    I spent 13 minutes examining this patient, reviewing medications, and using patient centered shared decision making involving her cardiac care.  Prior to her visit I spent greater than 20 minutes reviewing her past medical history,  medications, and prior cardiac  tests.

## 2023-02-15 ENCOUNTER — Ambulatory Visit: Payer: No Typology Code available for payment source | Attending: General Practice | Admitting: General Practice

## 2023-02-15 ENCOUNTER — Encounter: Payer: Self-pay | Admitting: General Practice

## 2023-02-15 VITALS — BP 122/62 | HR 62 | Ht 70.0 in | Wt 216.2 lb

## 2023-02-15 DIAGNOSIS — I480 Paroxysmal atrial fibrillation: Secondary | ICD-10-CM | POA: Diagnosis not present

## 2023-02-15 DIAGNOSIS — I10 Essential (primary) hypertension: Secondary | ICD-10-CM | POA: Diagnosis not present

## 2023-02-15 DIAGNOSIS — E785 Hyperlipidemia, unspecified: Secondary | ICD-10-CM | POA: Diagnosis not present

## 2023-02-15 DIAGNOSIS — Z9861 Coronary angioplasty status: Secondary | ICD-10-CM

## 2023-02-15 DIAGNOSIS — I2510 Atherosclerotic heart disease of native coronary artery without angina pectoris: Secondary | ICD-10-CM

## 2023-02-15 DIAGNOSIS — N183 Chronic kidney disease, stage 3 unspecified: Secondary | ICD-10-CM

## 2023-02-15 NOTE — Patient Instructions (Signed)
Medication Instructions:  MAY TAKE EXTRA 1/2 TAB METOPROLOL SUSTAINED PALPITATIONS *If you need a refill on your cardiac medications before your next appointment, please call your pharmacy*  Lab Work: NONE If you have labs (blood work) drawn today and your tests are completely normal, you will receive your results only by:  [place] (if you have MyChart) OR A paper copy in the mail If you have any lab test that is abnormal or we need to change your treatment, we will call you to review the results.  Testing/Procedures: NONE  Follow-Up: At [facility], you and your health needs are our priority.  As part of our continuing mission to provide you with exceptional heart care, we have created designated Provider Care Teams.  These Care Teams include your primary Cardiologist (physician) and Advanced Practice Providers (APPs -  Physician Assistants and Nurse Practitioners) who all work together to provide you with the care you need, when you need it.  Your next appointment:   OCTOBER 2024   Provider:   Glenetta Hew, MD     Other Instructions INCREASE HYDRATION  TAKE AND LOG YOUR BLOOD  PRESSURE AND BRING LOG TO FOLLOW UP APPOINTMENT   Please try to avoid these triggers: Do not use any products that have nicotine or tobacco in them. These include cigarettes, e-cigarettes, and chewing tobacco. If you need help quitting, ask your doctor. Eat heart-healthy foods. Talk with your doctor about the right eating plan for you. Exercise regularly as told by your doctor. Stay hydrated Do not drink alcohol, Caffeine or chocolate. Lose weight if you are overweight. Do not use drugs, including cannabis
